# Patient Record
Sex: Female | Born: 1950 | Race: White | Hispanic: No | Marital: Married | Smoking: Current every day smoker
Health system: Southern US, Community
[De-identification: ages and names within clinical notes are randomized; demographics above are authoritative.]

## PROBLEM LIST (undated history)

## (undated) DIAGNOSIS — F419 Anxiety disorder, unspecified: Secondary | ICD-10-CM

## (undated) DIAGNOSIS — F329 Major depressive disorder, single episode, unspecified: Secondary | ICD-10-CM

## (undated) DIAGNOSIS — J449 Chronic obstructive pulmonary disease, unspecified: Secondary | ICD-10-CM

## (undated) DIAGNOSIS — E785 Hyperlipidemia, unspecified: Secondary | ICD-10-CM

## (undated) DIAGNOSIS — F17200 Nicotine dependence, unspecified, uncomplicated: Secondary | ICD-10-CM

## (undated) DIAGNOSIS — F32A Depression, unspecified: Secondary | ICD-10-CM

## (undated) DIAGNOSIS — J40 Bronchitis, not specified as acute or chronic: Secondary | ICD-10-CM

## (undated) HISTORY — PX: ABDOMINAL HYSTERECTOMY: SHX81

## (undated) HISTORY — PX: CHOLECYSTECTOMY: SHX55

## (undated) HISTORY — PX: BACK SURGERY: SHX140

## (undated) HISTORY — PX: CARPAL TUNNEL RELEASE: SHX101

## (undated) HISTORY — PX: KNEE ARTHROSCOPY: SUR90

---

## 1999-11-17 ENCOUNTER — Other Ambulatory Visit: Admission: RE | Admit: 1999-11-17 | Discharge: 1999-11-17 | Payer: Self-pay | Admitting: Gynecology

## 2001-10-05 ENCOUNTER — Encounter: Payer: Self-pay | Admitting: Family Medicine

## 2001-10-05 ENCOUNTER — Encounter: Admission: RE | Admit: 2001-10-05 | Discharge: 2001-10-05 | Payer: Self-pay | Admitting: Family Medicine

## 2003-11-23 ENCOUNTER — Emergency Department (HOSPITAL_COMMUNITY): Admission: EM | Admit: 2003-11-23 | Discharge: 2003-11-24 | Payer: Self-pay | Admitting: Emergency Medicine

## 2003-12-10 ENCOUNTER — Other Ambulatory Visit: Admission: RE | Admit: 2003-12-10 | Discharge: 2003-12-10 | Payer: Self-pay | Admitting: Gynecology

## 2004-11-27 ENCOUNTER — Encounter: Admission: RE | Admit: 2004-11-27 | Discharge: 2004-11-27 | Payer: Self-pay | Admitting: Orthopaedic Surgery

## 2009-04-09 ENCOUNTER — Encounter: Admission: RE | Admit: 2009-04-09 | Discharge: 2009-04-09 | Payer: Self-pay | Admitting: Physician Assistant

## 2010-10-04 HISTORY — PX: CERVICAL DISC SURGERY: SHX588

## 2011-06-15 ENCOUNTER — Other Ambulatory Visit: Payer: Self-pay | Admitting: Orthopedic Surgery

## 2011-06-15 DIAGNOSIS — M25519 Pain in unspecified shoulder: Secondary | ICD-10-CM

## 2011-06-15 DIAGNOSIS — M542 Cervicalgia: Secondary | ICD-10-CM

## 2011-06-16 ENCOUNTER — Ambulatory Visit
Admission: RE | Admit: 2011-06-16 | Discharge: 2011-06-16 | Disposition: A | Discharge status: Home / Self Care | Payer: PRIVATE HEALTH INSURANCE | Source: Ambulatory Visit | Attending: Orthopedic Surgery | Admitting: Orthopedic Surgery

## 2011-06-16 DIAGNOSIS — M25519 Pain in unspecified shoulder: Secondary | ICD-10-CM

## 2011-06-16 DIAGNOSIS — M542 Cervicalgia: Secondary | ICD-10-CM

## 2011-06-23 ENCOUNTER — Other Ambulatory Visit (HOSPITAL_COMMUNITY): Payer: Self-pay | Admitting: Specialist

## 2011-06-23 ENCOUNTER — Encounter (HOSPITAL_COMMUNITY)
Admission: RE | Admit: 2011-06-23 | Discharge: 2011-06-23 | Disposition: A | Discharge status: Home / Self Care | Payer: PRIVATE HEALTH INSURANCE | Source: Ambulatory Visit | Attending: Specialist | Admitting: Specialist

## 2011-06-23 DIAGNOSIS — M5020 Other cervical disc displacement, unspecified cervical region: Secondary | ICD-10-CM

## 2011-06-23 LAB — DIFFERENTIAL
Basophils Relative: 0 % (ref 0–1)
Eosinophils Absolute: 0.5 10*3/uL (ref 0.0–0.7)
Monocytes Relative: 8 % (ref 3–12)
Neutrophils Relative %: 50 % (ref 43–77)

## 2011-06-23 LAB — URINALYSIS, ROUTINE W REFLEX MICROSCOPIC
Ketones, ur: 15 mg/dL — AB
Leukocytes, UA: NEGATIVE
Nitrite: NEGATIVE
Urobilinogen, UA: 0.2 mg/dL (ref 0.0–1.0)
pH: 5 (ref 5.0–8.0)

## 2011-06-23 LAB — CBC
MCH: 32.1 pg (ref 26.0–34.0)
Platelets: 285 10*3/uL (ref 150–400)
RBC: 4.68 MIL/uL (ref 3.87–5.11)
RDW: 13.7 % (ref 11.5–15.5)
WBC: 10.4 10*3/uL (ref 4.0–10.5)

## 2011-06-23 LAB — SURGICAL PCR SCREEN
MRSA, PCR: NEGATIVE
Staphylococcus aureus: NEGATIVE

## 2011-06-23 LAB — COMPREHENSIVE METABOLIC PANEL
ALT: 19 U/L (ref 0–35)
BUN: 13 mg/dL (ref 6–23)
Calcium: 9.9 mg/dL (ref 8.4–10.5)
Creatinine, Ser: 0.81 mg/dL (ref 0.50–1.10)
GFR calc Af Amer: >60 mL/min (ref >60)
GFR calc non Af Amer: >60 mL/min (ref >60)
Glucose, Bld: 83 mg/dL (ref 70–99)
Sodium: 140 mEq/L (ref 135–145)
Total Protein: 7 g/dL (ref 6.0–8.3)

## 2011-06-25 ENCOUNTER — Ambulatory Visit (HOSPITAL_COMMUNITY)
Admission: RE | Admit: 2011-06-25 | Discharge: 2011-06-26 | Disposition: A | Discharge status: Home / Self Care | Payer: PRIVATE HEALTH INSURANCE | Source: Ambulatory Visit | Attending: Specialist | Admitting: Specialist

## 2011-06-25 ENCOUNTER — Ambulatory Visit (HOSPITAL_COMMUNITY): Payer: PRIVATE HEALTH INSURANCE

## 2011-06-25 DIAGNOSIS — Z01812 Encounter for preprocedural laboratory examination: Secondary | ICD-10-CM | POA: Insufficient documentation

## 2011-06-25 DIAGNOSIS — M5020 Other cervical disc displacement, unspecified cervical region: Secondary | ICD-10-CM | POA: Insufficient documentation

## 2011-06-25 DIAGNOSIS — Z01818 Encounter for other preprocedural examination: Secondary | ICD-10-CM | POA: Insufficient documentation

## 2011-06-25 DIAGNOSIS — Z01810 Encounter for preprocedural cardiovascular examination: Secondary | ICD-10-CM | POA: Insufficient documentation

## 2011-06-25 DIAGNOSIS — F17200 Nicotine dependence, unspecified, uncomplicated: Secondary | ICD-10-CM | POA: Insufficient documentation

## 2011-07-06 NOTE — Op Note (Signed)
Tina [name], Tina [name] NO.:  [id number]  MEDICAL RECORD NO.:  [id number]  LOCATION:  5015                         FACILITY:  [facility]  PHYSICIAN:  Kerrin Champagne, M.D.   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  Minimal protrusion at C6-C7.  POSTOPERATIVE DIAGNOSIS:  A large central disk protrusion with disk osteophyte complex, disk material extending centrally into the spinal canal causing cord compression.  PROCEDURES:  Anterior cervical diskectomy with excision of HNP utilizing the operating room microscope, excision of posterior lip osteophytes at the C5 and C6 level with fusion utilizing musculoskeletal transplant foundation allograft 9 mm height x 13 mm depth in combination with local bone graft.  DePuy Slimline 14-mm plate with 16-XW screws.  SURGEON:  Kerrin Champagne, MD  ASSISTANT:  Patrick Jupiter, CRNFA.  ANESTHESIA:  General via orotracheal intubation, Dr. Krista Blue, supplemented with local infiltration with Marcaine 0.5% with 1:200,000 epinephrine, 10 mL.  DRAINS:  A 7-French TLS drain anterior neck.  COMPLICATIONS:  None.  BRIEF CLINICAL HISTORY:  The patient is a 60 year old female, who presents with severe neck pain radiation into her shoulders.  Pain has been present for over 4 weeks.  She was seen initially and evaluated by my partner.  MRI scan was obtained which demonstrated a large disk herniation centrally at the C5-C6 level with cord compression and very early edema changes.  The patient's pain is diffuse, posterior neck, interscapular into the shoulders without a great deal of her extremity weakness.  She has severe pain requiring narcotic medicines to relieve her discomfort.  Guarded cervical motion.  This disk herniation as showing cord compression, it was felt that  anterior cervical diskectomy fusion was appropriate in order to relieve her pain for improvement in overall function and decrease risk of developing myelomalacia and myelopathy.  Cervical canal was narrowed down to nearly 5 mm.  Small protrusion also at the C6-C7 level, however, it was felt not to be compressive.  DESCRIPTION OF PROCEDURE:  This patient was seen in the preoperative holding area after discussion regarding surgery, risks involved, all questions were answered, she has signed informed consent.  She had marking of the left neck skin crease over the expected incision at the C5-C6 level.  She had received standard preoperative antibiotics of Ancef.  The patient transported via a stretcher to [facility] room #[facility] at [facility] where the procedure was performed.  There, she underwent induction of general anesthesia after first transferred to the OR table. Following atraumatic intubation and induction of anesthesia, the patient had 5 pounds cervical Halter traction placed with the neck in very slight extension every 5-10 degrees.  Wrist restraints were used for radiographs intraoperatively.  PAS hose with the patient in a semi-beach- chair position.  Standard prep with DuraPrep solution over the anterior neck and upper chest, draped in the usual manner.  Iodine Vi-Drape was used.  Standard time-out protocol identifying the patient and level to be performed, expected length of case, estimated  blood loss.  This incision was then made of the skin crease at the expected C5-C6 level approximately 3-3.5 inches in length through the skin and subcu layers down to the platysma layer.  This was incised in line with the skin incision and the interval between the trachea and esophagus, medial carotid sheath and lateral was then developed bluntly to the anterior aspect of cervical spine.  The patient's trachea and esophagus were then carefully retracted medially where the longus colli muscle  identified and Kittner dissector was used to tease the prevertebral fascia across midline.  Carotid tubercle identified and spinal needle 18 gauge with only a centimeter of the needle protruding beyond the sheath was then carefully inserted at the expected C5-C6 level.  Intraoperative lateral radiograph demonstrated the needle at the C5-C6 level.  With this then, handheld Cloward retractors were used and the needle carefully extracted.  A 15 blade scalpel was used to incise disk anteriorly and disk was then carefully removed for continued identification of this level throughout the procedure.  Electrocautery was used to carefully debride periosteum off the anterior aspect of the disk space at the C5-C6 level up above and below the disk space by about 3-4 mm.  Osteophytes anteriorly were carefully resected and preserved for later use for bone grafting of the central portions of the allograft.  The medial border of the longus colli muscle was carefully developed bilaterally and then [facility] retractor placed with further blade beneath the medial border of the longus colli muscle.  The 14-mm screw posts were then inserted into the vertebral bodies at C5 and C6 and distraction of the disk space obtained.  Using loupe medication and headlamp, the anterior aspect of the disk was then excised from anterior to posterior using a 15 blade scalpel, pituitary rongeurs, and then 4 straight forward-angled 2-0 and 3-0 micro curettes, debriding the endplates, cartilaginous endplate material as well as degenerative disk within the disk space back to the posterior lip osteophytes and posterior annulus at this level.  Wherever possible bone that was removed was saved for later bone grafting purposes.  Operating room microscope was carefully draped sterilely, brought into the field under the OR microscope and then the posterior lip osteophytes were resected.  This bone also carefully preserved  for later use for bone grafting purposes beginning centrally on the right side, osteophytes resected, then a foraminotomy performed on the right C6 nerve root.  Then to the left side, similarly resecting posterior lip osteophytes of the posterosuperior aspect of C6 and then resecting disk material centrally using pituitary rongeurs as well as a micro titanium nerve hook, removing and debriding this off the spinal cord.  Posterior longitudinal ligament was completely resected side-to-side in order to allow for further decompression of the cord.  Posterior lip osteophyte off the posteroinferior aspect of C5 was then resected similarly from side-to-side left-to-right decompressing the spinal canal of C6 nerve roots.  Tough bone had been removed to allow for packing of bone graft. The height of the intervertebral disk space then determined using sounders after first determining the depth of the disk space at about 19 mm.  Checking first with an 8 mm which was not a good fit, 9 mm provided best fit for height at this space.  High-speed bur was used carefully parallel the endplates of C5 and C6.  The expected allograft was then carefully packed centrally with bone graft provided locally.  This was then completed, the allograft was then carefully inserted. Care  was taken to ensure there was no bone or soft tissue remaining that could be retropulsed with insertion of the graft.  Note that thrombin- soaked Gelfoam was used to obtain hemostasis over the endplates and this was removed.  The graft was then carefully inserted in place and packed it into place without difficulty.  Subset beneath the anterior aspect of the disk space by 1 or 2 mm.  Screw posts were then carefully removed, 5- pound cervical Halter traction released.  Anterior lip osteophytes were carefully trimmed and thinned to allow for placement of the plate, needle against the anterior surface of the disk space and vertebral  body of C5-C6.  A 14-mm plate was chosen, plate carefully placed across the disk space at the C5-C6 level, and first screw be placed on the left side at C5 using a 14-mm drill with soft tissue sleeve, positive stop. This drill hole was made without difficulty and 14-mm screw placed and then on the left at the C6 level, similarly drilling 14 mm then placing the screw.  On the right side at C6 drilling 14 mm and placing a 14-mm screw obtaining purchase that was excellent.  With these, final screw on the right side at the C5 level was drilled and screw placed.  However, the screw did not appear to capture well so that a revision screw was used at this level 14 mm in length and this did obtain excellent purchase.  All the screws subset into the plate quite nicely.  The locking screw was then carefully turned for each of the screws using the appropriate triangular screwdriver.  This completed the fixation at this level.  Irrigation was carried out.  A small bleeder over the left side vein was carefully suture ligated with 0 Vicryl stitch.  Note that, following irrigation then a 7-French TLS drain was then placed in the depth of the incision exiting over the inferior aspect of the incision site, sewn in place with a 4-0 nylon stitch.  Intraoperative lateral radiograph obtained demonstrated plates and screws in excellent position and alignment with no sign of retropulsion of graft or screws at this point.  With this then, irrigation was further carried out and esophagus examined and appeared normal.  Platysmal layer was reapproximated with interrupted 2-0 Vicryl sutures, deep subcu layers approximated with interrupted 3-0 Vicryl sutures, and the skin closed with a running subcu stitch of 4-0 Vicryl.  Dermabond was applied and a Mepilex bandage. Soft cervical collar.  The patient was then carefully returned to her bed, reactivated, extubated, returned to recovery room in satisfactory condition.   All instrument and sponge counts were correct.  CRNFA ASSISTANT'S RESPONSIBILITY:  Patrick Jupiter, performed the duties of CRNFA assisting throughout the case, careful suctioning, neural elements, and careful resecting bone to be utilized for later bone grafting purposes, placing the screw within the plate on the right side during this case.  She operated under the operating room microscope and assisted throughout the procedure.     Kerrin Champagne, M.D.     JEN/MEDQ  D:  06/25/2011  T:  06/25/2011  Job:  [id number]  Electronically Signed by Vira Browns M.D. on 07/06/2011 06:16:00 PM

## 2011-09-15 ENCOUNTER — Other Ambulatory Visit: Payer: Self-pay | Admitting: Specialist

## 2011-09-15 DIAGNOSIS — M545 Low back pain, unspecified: Secondary | ICD-10-CM

## 2011-09-16 ENCOUNTER — Ambulatory Visit
Admission: RE | Admit: 2011-09-16 | Discharge: 2011-09-16 | Disposition: A | Discharge status: Home / Self Care | Payer: PRIVATE HEALTH INSURANCE | Source: Ambulatory Visit | Attending: Specialist | Admitting: Specialist

## 2011-09-16 DIAGNOSIS — M545 Low back pain, unspecified: Secondary | ICD-10-CM

## 2011-09-23 ENCOUNTER — Other Ambulatory Visit (HOSPITAL_COMMUNITY): Payer: Self-pay | Admitting: Specialist

## 2011-09-23 ENCOUNTER — Encounter (HOSPITAL_COMMUNITY): Payer: Self-pay | Admitting: Respiratory Therapy

## 2011-09-24 ENCOUNTER — Encounter (HOSPITAL_COMMUNITY)
Admission: RE | Admit: 2011-09-24 | Discharge: 2011-09-24 | Disposition: A | Discharge status: Home / Self Care | Payer: PRIVATE HEALTH INSURANCE | Source: Ambulatory Visit | Attending: Specialist | Admitting: Specialist

## 2011-09-24 ENCOUNTER — Encounter (HOSPITAL_COMMUNITY): Payer: Self-pay

## 2011-09-24 HISTORY — DX: Major depressive disorder, single episode, unspecified: F32.9

## 2011-09-24 HISTORY — DX: Depression, unspecified: F32.A

## 2011-09-24 HISTORY — DX: Bronchitis, not specified as acute or chronic: J40

## 2011-09-24 LAB — CBC
Platelets: 274 10*3/uL (ref 150–400)
RBC: 4.62 MIL/uL (ref 3.87–5.11)
RDW: 13.8 % (ref 11.5–15.5)
WBC: 10.7 10*3/uL — ABNORMAL HIGH (ref 4.0–10.5)

## 2011-09-24 LAB — BASIC METABOLIC PANEL
CO2: 29 mEq/L (ref 19–32)
Chloride: 102 mEq/L (ref 96–112)
GFR calc Af Amer: >90 mL/min (ref >90)
Sodium: 139 mEq/L (ref 135–145)

## 2011-09-24 LAB — SURGICAL PCR SCREEN
MRSA, PCR: NEGATIVE
Staphylococcus aureus: NEGATIVE

## 2011-09-24 MED ORDER — CHLORHEXIDINE GLUCONATE 4 % EX LIQD: 60.0000 mL | Freq: Once | CUTANEOUS | Status: DC

## 2011-09-24 NOTE — Progress Notes (Signed)
Ekg, chest in epic from 9/12

## 2011-09-24 NOTE — Pre-Procedure Instructions (Signed)
[address]  09/24/2011   Your procedure is scheduled on:  09/30/11   Report to [facility] at 1030 AM.  Call this number if you have problems the morning of surgery: [phone]   Remember:   Do not eat food:After Midnight.  May have clear liquids: up to 4 Hours before arrival.  Clear liquids include soda, tea, black coffee, apple or grape juice, broth.  Take these medicines the morning of surgery with A SIP OF WATER:xanax , pain med   Do not wear jewelry, make-up or nail polish.  Do not wear lotions, powders, or perfumes. You may wear deodorant.  Do not shave 48 hours prior to surgery.  Do not bring valuables to the hospital.  Contacts, dentures or bridgework may not be worn into surgery.  Leave suitcase in the car. After surgery it may be brought to your room.  For patients admitted to the hospital, checkout time is 11:00 AM the day of discharge.   Patients discharged the day of surgery will not be allowed to drive home.  Name and phone number of your driver:[phone]  Special Instructions: CHG Shower Use Special Wash: 1/2 bottle night before surgery and 1/2 bottle morning of surgery.   Please read over the following fact sheets that you were given: Pain Booklet, Coughing and Deep Breathing, MRSA Information and Surgical Site Infection Prevention

## 2011-09-29 MED ORDER — CEFAZOLIN SODIUM-DEXTROSE 2-3 GM-% IV SOLR
2.0000 g | INTRAVENOUS | Status: AC
  Administered 2011-09-30: 2 g via INTRAVENOUS
  Filled 2011-09-29: qty 50

## 2011-09-30 ENCOUNTER — Encounter (HOSPITAL_COMMUNITY): Payer: Self-pay | Admitting: *Deleted

## 2011-09-30 ENCOUNTER — Encounter (HOSPITAL_COMMUNITY)
Admission: RE | Disposition: A | Discharge status: Home / Self Care | Payer: Self-pay | Source: Ambulatory Visit | Attending: Specialist

## 2011-09-30 ENCOUNTER — Ambulatory Visit (HOSPITAL_COMMUNITY)
Admission: RE | Admit: 2011-09-30 | Discharge: 2011-10-01 | Disposition: A | Discharge status: Home / Self Care | Payer: PRIVATE HEALTH INSURANCE | Source: Ambulatory Visit | Attending: Specialist | Admitting: Specialist

## 2011-09-30 ENCOUNTER — Encounter (HOSPITAL_COMMUNITY): Payer: Self-pay | Admitting: Anesthesiology

## 2011-09-30 ENCOUNTER — Ambulatory Visit (HOSPITAL_COMMUNITY): Payer: PRIVATE HEALTH INSURANCE

## 2011-09-30 ENCOUNTER — Ambulatory Visit (HOSPITAL_COMMUNITY): Payer: PRIVATE HEALTH INSURANCE | Admitting: Anesthesiology

## 2011-09-30 ENCOUNTER — Encounter (HOSPITAL_COMMUNITY): Payer: Self-pay | Admitting: Specialist

## 2011-09-30 DIAGNOSIS — G8929 Other chronic pain: Secondary | ICD-10-CM | POA: Insufficient documentation

## 2011-09-30 DIAGNOSIS — F329 Major depressive disorder, single episode, unspecified: Secondary | ICD-10-CM | POA: Insufficient documentation

## 2011-09-30 DIAGNOSIS — F17200 Nicotine dependence, unspecified, uncomplicated: Secondary | ICD-10-CM | POA: Insufficient documentation

## 2011-09-30 DIAGNOSIS — G9741 Accidental puncture or laceration of dura during a procedure: Secondary | ICD-10-CM | POA: Insufficient documentation

## 2011-09-30 DIAGNOSIS — Y9210 Unspecified residential institution as the place of occurrence of the external cause: Secondary | ICD-10-CM | POA: Insufficient documentation

## 2011-09-30 DIAGNOSIS — IMO0002 Reserved for concepts with insufficient information to code with codable children: Secondary | ICD-10-CM | POA: Insufficient documentation

## 2011-09-30 DIAGNOSIS — J449 Chronic obstructive pulmonary disease, unspecified: Secondary | ICD-10-CM | POA: Insufficient documentation

## 2011-09-30 DIAGNOSIS — F3289 Other specified depressive episodes: Secondary | ICD-10-CM | POA: Insufficient documentation

## 2011-09-30 DIAGNOSIS — M5126 Other intervertebral disc displacement, lumbar region: Secondary | ICD-10-CM | POA: Insufficient documentation

## 2011-09-30 DIAGNOSIS — Z01812 Encounter for preprocedural laboratory examination: Secondary | ICD-10-CM | POA: Insufficient documentation

## 2011-09-30 DIAGNOSIS — J4489 Other specified chronic obstructive pulmonary disease: Secondary | ICD-10-CM | POA: Insufficient documentation

## 2011-09-30 DIAGNOSIS — M5116 Intervertebral disc disorders with radiculopathy, lumbar region: Secondary | ICD-10-CM

## 2011-09-30 HISTORY — PX: LUMBAR LAMINECTOMY: SHX95

## 2011-09-30 SURGERY — MICRODISCECTOMY LUMBAR LAMINECTOMY
Anesthesia: General | Site: Back | Laterality: Right | Wound class: Clean

## 2011-09-30 MED ORDER — GLYCOPYRROLATE 0.2 MG/ML IJ SOLN
INTRAMUSCULAR | Status: DC | PRN
  Administered 2011-09-30: .6 mg via INTRAVENOUS

## 2011-09-30 MED ORDER — PROMETHAZINE HCL 25 MG/ML IJ SOLN
6.2500 mg | INTRAMUSCULAR | Status: DC | PRN
  Administered 2011-09-30: 12.5 mg via INTRAVENOUS

## 2011-09-30 MED ORDER — ONDANSETRON HCL 4 MG/2ML IJ SOLN
INTRAMUSCULAR | Status: DC | PRN
  Administered 2011-09-30 (×2): 4 mg via INTRAVENOUS

## 2011-09-30 MED ORDER — ALPRAZOLAM 0.5 MG PO TABS
0.5000 mg | ORAL_TABLET | Freq: Two times a day (BID) | ORAL | Status: DC
  Administered 2011-09-30 – 2011-10-01 (×2): 0.5 mg via ORAL
  Filled 2011-09-30 (×2): qty 1

## 2011-09-30 MED ORDER — PROMETHAZINE HCL 25 MG/ML IJ SOLN
INTRAMUSCULAR | Status: AC
  Filled 2011-09-30: qty 1

## 2011-09-30 MED ORDER — FENTANYL CITRATE 0.05 MG/ML IJ SOLN: 50.0000 ug | INTRAMUSCULAR | Status: DC | PRN

## 2011-09-30 MED ORDER — ACETAMINOPHEN 650 MG RE SUPP: 650.0000 mg | RECTAL | Status: DC | PRN

## 2011-09-30 MED ORDER — NEOSTIGMINE METHYLSULFATE 1 MG/ML IJ SOLN
INTRAMUSCULAR | Status: DC | PRN
  Administered 2011-09-30: 5 mg via INTRAVENOUS

## 2011-09-30 MED ORDER — THERA M PLUS PO TABS: 1.0000 | ORAL_TABLET | Freq: Every day | ORAL | Status: DC

## 2011-09-30 MED ORDER — PROPOFOL 10 MG/ML IV EMUL
INTRAVENOUS | Status: DC | PRN
  Administered 2011-09-30: 150 mg via INTRAVENOUS

## 2011-09-30 MED ORDER — ROCURONIUM BROMIDE 100 MG/10ML IV SOLN
INTRAVENOUS | Status: DC | PRN
  Administered 2011-09-30: 20 mg via INTRAVENOUS
  Administered 2011-09-30: 50 mg via INTRAVENOUS
  Administered 2011-09-30: 20 mg via INTRAVENOUS
  Administered 2011-09-30: 10 mg via INTRAVENOUS

## 2011-09-30 MED ORDER — VECURONIUM BROMIDE 10 MG IV SOLR
INTRAVENOUS | Status: DC | PRN
  Administered 2011-09-30: 2 mg via INTRAVENOUS
  Administered 2011-09-30: 3 mg via INTRAVENOUS

## 2011-09-30 MED ORDER — POTASSIUM CHLORIDE IN NACL 20-0.9 MEQ/L-% IV SOLN
INTRAVENOUS | Status: DC
  Administered 2011-09-30: 22:00:00 via INTRAVENOUS
  Filled 2011-09-30 (×3): qty 1000

## 2011-09-30 MED ORDER — ALUM & MAG HYDROXIDE-SIMETH 200-200-20 MG/5ML PO SUSP: 30.0000 mL | Freq: Four times a day (QID) | ORAL | Status: DC | PRN

## 2011-09-30 MED ORDER — DEXTROSE 5 % IV SOLN
INTRAVENOUS | Status: DC | PRN
  Administered 2011-09-30: 13:00:00 via INTRAVENOUS

## 2011-09-30 MED ORDER — CEFAZOLIN SODIUM 1-5 GM-% IV SOLN
1.0000 g | Freq: Three times a day (TID) | INTRAVENOUS | Status: AC
  Administered 2011-09-30 – 2011-10-01 (×2): 1 g via INTRAVENOUS
  Filled 2011-09-30 (×2): qty 50

## 2011-09-30 MED ORDER — PHENOL 1.4 % MT LIQD
1.0000 | OROMUCOSAL | Status: DC | PRN
  Filled 2011-09-30: qty 177

## 2011-09-30 MED ORDER — BUPIVACAINE-EPINEPHRINE 0.5% -1:200000 IJ SOLN
INTRAMUSCULAR | Status: DC | PRN
  Administered 2011-09-30: 20 mL

## 2011-09-30 MED ORDER — METHOCARBAMOL 100 MG/ML IJ SOLN
500.0000 mg | Freq: Four times a day (QID) | INTRAVENOUS | Status: DC | PRN
  Administered 2011-09-30: 500 mg via INTRAVENOUS
  Filled 2011-09-30: qty 5

## 2011-09-30 MED ORDER — 0.9 % SODIUM CHLORIDE (POUR BTL) OPTIME
TOPICAL | Status: DC | PRN
  Administered 2011-09-30: 1000 mL

## 2011-09-30 MED ORDER — LACTATED RINGERS IV SOLN
INTRAVENOUS | Status: DC | PRN
  Administered 2011-09-30 (×3): via INTRAVENOUS

## 2011-09-30 MED ORDER — SODIUM CHLORIDE 0.9 % IJ SOLN: 3.0000 mL | Freq: Two times a day (BID) | INTRAMUSCULAR | Status: DC

## 2011-09-30 MED ORDER — MIDAZOLAM HCL 5 MG/5ML IJ SOLN
INTRAMUSCULAR | Status: DC | PRN
  Administered 2011-09-30: 2 mg via INTRAVENOUS

## 2011-09-30 MED ORDER — SERTRALINE HCL 50 MG PO TABS
175.0000 mg | ORAL_TABLET | Freq: Every day | ORAL | Status: DC
  Administered 2011-10-01: 175 mg via ORAL
  Filled 2011-09-30: qty 1

## 2011-09-30 MED ORDER — DROPERIDOL 2.5 MG/ML IJ SOLN
INTRAMUSCULAR | Status: DC | PRN
  Administered 2011-09-30: 0.625 mg via INTRAVENOUS

## 2011-09-30 MED ORDER — HETASTARCH-ELECTROLYTES 6 % IV SOLN: INTRAVENOUS | Status: DC | PRN

## 2011-09-30 MED ORDER — SODIUM CHLORIDE 0.9 % IJ SOLN: 3.0000 mL | INTRAMUSCULAR | Status: DC | PRN

## 2011-09-30 MED ORDER — SODIUM CHLORIDE 0.9 % IV SOLN: 250.0000 mL | INTRAVENOUS | Status: DC

## 2011-09-30 MED ORDER — EPHEDRINE SULFATE 50 MG/ML IJ SOLN
INTRAMUSCULAR | Status: DC | PRN
  Administered 2011-09-30: 10 mg via INTRAVENOUS
  Administered 2011-09-30 (×2): 5 mg via INTRAVENOUS

## 2011-09-30 MED ORDER — PHENYLEPHRINE HCL 10 MG/ML IJ SOLN
INTRAMUSCULAR | Status: DC | PRN
  Administered 2011-09-30 (×3): 40 ug via INTRAVENOUS

## 2011-09-30 MED ORDER — ONDANSETRON HCL 4 MG/2ML IJ SOLN: 4.0000 mg | INTRAMUSCULAR | Status: DC | PRN

## 2011-09-30 MED ORDER — PANTOPRAZOLE SODIUM 40 MG IV SOLR
40.0000 mg | Freq: Every day | INTRAVENOUS | Status: DC
  Administered 2011-09-30: 40 mg via INTRAVENOUS
  Filled 2011-09-30 (×2): qty 40

## 2011-09-30 MED ORDER — LACTATED RINGERS IV SOLN
INTRAVENOUS | Status: DC
  Administered 2011-09-30: 11:00:00 via INTRAVENOUS

## 2011-09-30 MED ORDER — FENTANYL CITRATE 0.05 MG/ML IJ SOLN
INTRAMUSCULAR | Status: AC
  Filled 2011-09-30: qty 2

## 2011-09-30 MED ORDER — ONDANSETRON HCL 4 MG/2ML IJ SOLN: 4.0000 mg | Freq: Four times a day (QID) | INTRAMUSCULAR | Status: DC | PRN

## 2011-09-30 MED ORDER — DESIPRAMINE HCL 50 MG PO TABS
50.0000 mg | ORAL_TABLET | Freq: Every day | ORAL | Status: DC
  Administered 2011-09-30: 50 mg via ORAL
  Filled 2011-09-30 (×2): qty 1

## 2011-09-30 MED ORDER — THROMBIN 20000 UNITS EX KIT
PACK | CUTANEOUS | Status: DC | PRN
  Administered 2011-09-30: 14:00:00 via TOPICAL

## 2011-09-30 MED ORDER — ZOLPIDEM TARTRATE 10 MG PO TABS: 10.0000 mg | ORAL_TABLET | Freq: Every evening | ORAL | Status: DC | PRN

## 2011-09-30 MED ORDER — ADULT MULTIVITAMIN W/MINERALS CH
1.0000 | ORAL_TABLET | Freq: Every day | ORAL | Status: DC
Start: 2011-10-01 — End: 2011-10-01
  Administered 2011-10-01: 1 via ORAL
  Filled 2011-09-30: qty 1

## 2011-09-30 MED ORDER — THROMBIN 20000 UNITS EX SOLR: CUTANEOUS | Status: DC | PRN

## 2011-09-30 MED ORDER — MENTHOL 3 MG MT LOZG: 1.0000 | LOZENGE | OROMUCOSAL | Status: DC | PRN

## 2011-09-30 MED ORDER — FENTANYL CITRATE 0.05 MG/ML IJ SOLN
25.0000 ug | INTRAMUSCULAR | Status: DC | PRN
  Administered 2011-09-30 (×3): 50 ug via INTRAVENOUS

## 2011-09-30 MED ORDER — PHENYLEPHRINE HCL 10 MG/ML IJ SOLN
10.0000 mg | INTRAVENOUS | Status: DC | PRN
  Administered 2011-09-30: 10 ug/min via INTRAVENOUS

## 2011-09-30 MED ORDER — LORAZEPAM 2 MG/ML IJ SOLN: 1.0000 mg | Freq: Once | INTRAMUSCULAR | Status: DC | PRN

## 2011-09-30 MED ORDER — METHOCARBAMOL 500 MG PO TABS
500.0000 mg | ORAL_TABLET | Freq: Four times a day (QID) | ORAL | Status: DC | PRN
  Administered 2011-10-01 (×3): 500 mg via ORAL
  Filled 2011-09-30 (×4): qty 1

## 2011-09-30 MED ORDER — MIDAZOLAM HCL 2 MG/2ML IJ SOLN: 1.0000 mg | INTRAMUSCULAR | Status: DC | PRN

## 2011-09-30 MED ORDER — SIMVASTATIN 20 MG PO TABS
20.0000 mg | ORAL_TABLET | Freq: Every day | ORAL | Status: DC
  Administered 2011-09-30: 20 mg via ORAL
  Filled 2011-09-30 (×2): qty 1

## 2011-09-30 MED ORDER — HYDROCODONE-ACETAMINOPHEN 5-325 MG PO TABS
1.0000 | ORAL_TABLET | ORAL | Status: DC | PRN
  Administered 2011-09-30: 1 via ORAL
  Administered 2011-10-01 (×2): 2 via ORAL
  Administered 2011-10-01: 1 via ORAL
  Administered 2011-10-01: 2 via ORAL
  Filled 2011-09-30 (×2): qty 1
  Filled 2011-09-30 (×3): qty 2

## 2011-09-30 MED ORDER — HEMOSTATIC AGENTS (NO CHARGE) OPTIME
TOPICAL | Status: DC | PRN
  Administered 2011-09-30: 1 via TOPICAL

## 2011-09-30 MED ORDER — HETASTARCH-ELECTROLYTES 6 % IV SOLN
INTRAVENOUS | Status: DC | PRN
  Administered 2011-09-30 (×2): via INTRAVENOUS

## 2011-09-30 MED ORDER — FENTANYL CITRATE 0.05 MG/ML IJ SOLN
INTRAMUSCULAR | Status: DC | PRN
  Administered 2011-09-30 (×5): 50 ug via INTRAVENOUS
  Administered 2011-09-30: 100 ug via INTRAVENOUS
  Administered 2011-09-30: 150 ug via INTRAVENOUS
  Administered 2011-09-30: 100 ug via INTRAVENOUS

## 2011-09-30 MED ORDER — DEXAMETHASONE SODIUM PHOSPHATE 4 MG/ML IJ SOLN
INTRAMUSCULAR | Status: DC | PRN
  Administered 2011-09-30: 8 mg via INTRAVENOUS

## 2011-09-30 SURGICAL SUPPLY — 67 items
ADH SKN CLS APL DERMABOND .7 (GAUZE/BANDAGES/DRESSINGS) ×1
ADH SKN CLS LQ APL DERMABOND (GAUZE/BANDAGES/DRESSINGS) ×1
BUR RND FLUTED 2.5 (BURR) IMPLANT
BUR ROUND FLUTED 4 SOFT TCH (BURR) IMPLANT
BUR SABER RD CUTTING 3.0 (BURR) ×2 IMPLANT
CANISTER SUCTION 2500CC (MISCELLANEOUS) ×2 IMPLANT
CLOTH BEACON ORANGE TIMEOUT ST (SAFETY) ×2 IMPLANT
CORDS BIPOLAR (ELECTRODE) ×2 IMPLANT
COVER SURGICAL LIGHT HANDLE (MISCELLANEOUS) ×2 IMPLANT
COVIDIEN EXTENDED TIP APPLICATOR 8CM ×1 IMPLANT
DERMABOND ADHESIVE PROPEN (GAUZE/BANDAGES/DRESSINGS) ×1
DERMABOND ADVANCED (GAUZE/BANDAGES/DRESSINGS) ×1
DERMABOND ADVANCED .7 DNX12 (GAUZE/BANDAGES/DRESSINGS) ×1 IMPLANT
DERMABOND ADVANCED .7 DNX6 (GAUZE/BANDAGES/DRESSINGS) IMPLANT
DRAPE C-ARM 42X72 X-RAY (DRAPES) ×2 IMPLANT
DRAPE MICROSCOPE LEICA (MISCELLANEOUS) ×3 IMPLANT
DRAPE POUCH INSTRU U-SHP 10X18 (DRAPES) ×2 IMPLANT
DRAPE PROXIMA HALF (DRAPES) ×2 IMPLANT
DRAPE SURG 17X23 STRL (DRAPES) ×8 IMPLANT
DRSG MEPILEX BORDER 4X4 (GAUZE/BANDAGES/DRESSINGS) ×1 IMPLANT
DRSG MEPILEX BORDER 4X8 (GAUZE/BANDAGES/DRESSINGS) IMPLANT
DURA SEAL 5ML(DURAL SEALANT SYSTEM POLYMER KIT) ×1 IMPLANT
DURAPREP 26ML APPLICATOR (WOUND CARE) ×2 IMPLANT
ELECT BLADE 4.0 EZ CLEAN MEGAD (MISCELLANEOUS) ×2
ELECT CAUTERY BLADE 6.4 (BLADE) ×2 IMPLANT
ELECT REM PT RETURN 9FT ADLT (ELECTROSURGICAL) ×2
ELECTRODE BLDE 4.0 EZ CLN MEGD (MISCELLANEOUS) ×1 IMPLANT
ELECTRODE REM PT RTRN 9FT ADLT (ELECTROSURGICAL) ×1 IMPLANT
GLOVE BIOGEL M STRL SZ7.5 (GLOVE) ×1 IMPLANT
GLOVE BIOGEL PI IND STRL 7.5 (GLOVE) ×1 IMPLANT
GLOVE BIOGEL PI INDICATOR 7.5 (GLOVE) ×1
GLOVE ECLIPSE 7.0 STRL STRAW (GLOVE) ×1 IMPLANT
GLOVE ECLIPSE 7.5 STRL STRAW (GLOVE) ×2 IMPLANT
GLOVE ECLIPSE 8.5 STRL (GLOVE) ×2 IMPLANT
GLOVE SKINSENSE NS SZ7.5 (GLOVE) ×1
GLOVE SKINSENSE STRL SZ7.5 (GLOVE) IMPLANT
GLOVE SS BIOGEL STRL SZ 7.5 (GLOVE) IMPLANT
GLOVE SUPERSENSE BIOGEL SZ 7.5 (GLOVE) ×1
GLOVE SURG 8.5 LATEX PF (GLOVE) ×2 IMPLANT
GOWN PREVENTION PLUS LG XLONG (DISPOSABLE) IMPLANT
GOWN STRL NON-REIN LRG LVL3 (GOWN DISPOSABLE) ×6 IMPLANT
GOWN STRL REIN 2XL LVL4 (GOWN DISPOSABLE) ×2 IMPLANT
KIT BASIN OR (CUSTOM PROCEDURE TRAY) ×2 IMPLANT
KIT ROOM TURNOVER OR (KITS) ×2 IMPLANT
NDL SPNL 18GX3.5 QUINCKE PK (NEEDLE) ×2 IMPLANT
NEEDLE 22X1 1/2 (OR ONLY) (NEEDLE) ×2 IMPLANT
NEEDLE SPNL 18GX3.5 QUINCKE PK (NEEDLE) ×4 IMPLANT
NS IRRIG 1000ML POUR BTL (IV SOLUTION) ×2 IMPLANT
PACK LAMINECTOMY ORTHO (CUSTOM PROCEDURE TRAY) ×2 IMPLANT
PAD ARMBOARD 7.5X6 YLW CONV (MISCELLANEOUS) ×4 IMPLANT
PATTIES SURGICAL .5 X.5 (GAUZE/BANDAGES/DRESSINGS) ×4 IMPLANT
PATTIES SURGICAL .75X.75 (GAUZE/BANDAGES/DRESSINGS) IMPLANT
SPONGE INTESTINAL PEANUT (DISPOSABLE) ×1 IMPLANT
SPONGE LAP 4X18 X RAY DECT (DISPOSABLE) ×2 IMPLANT
SPONGE SURGIFOAM ABS GEL 100 (HEMOSTASIS) ×1 IMPLANT
SUT NURALON 4 0 TR CR/8 (SUTURE) ×1 IMPLANT
SUT VIC AB 1 CT1 27 (SUTURE) ×4
SUT VIC AB 1 CT1 27XBRD ANBCTR (SUTURE) IMPLANT
SUT VIC AB 2-0 CT1 27 (SUTURE) ×2
SUT VIC AB 2-0 CT1 TAPERPNT 27 (SUTURE) IMPLANT
SUT VICRYL 0 UR6 27IN ABS (SUTURE) IMPLANT
SUT VICRYL 4-0 PS2 18IN ABS (SUTURE) ×1 IMPLANT
SYR CONTROL 10ML LL (SYRINGE) ×2 IMPLANT
TOWEL OR 17X24 6PK STRL BLUE (TOWEL DISPOSABLE) ×2 IMPLANT
TOWEL OR 17X26 10 PK STRL BLUE (TOWEL DISPOSABLE) ×2 IMPLANT
TRAY FOLEY CATH 14FR (SET/KITS/TRAYS/PACK) ×1 IMPLANT
WATER STERILE IRR 1000ML POUR (IV SOLUTION) ×2 IMPLANT

## 2011-09-30 NOTE — Op Note (Signed)
09/30/2011  6:06 PM  PATIENT:  Tina [name]  60 y.o. 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    SURGEON:  Kerrin Champagne, MD     ANESTHESIA:  General, Dr. Jacklynn Bue    COMPLICATIONS: Small dural the right dural bleb/tear 2-3 mm superior shoulder of the L5 nerve root at 5 o'clock.   PROCEDURE: The patient was met in the holding area, and the appropriate lumbar level identified and marked with "x" and my initials. All questions were answered and informed consent signed. The patient was then transported to OR the stretcher. The patient was then placed under  general anesthesia without difficulty. She was then transferred to the OR table prone position Wilson frame using a sliding OR table. The patient received appropriate preoperative antibiotic prophylaxis of Ancef.  . Nursing staff inserted a Foley catheter under sterile conditions.   Time-out procedure was called and correct. Standard prep with DuraPrep solution and the lower dorsal spine and mid sacral level. The old incision scar was used as a landmark along with top of the pelvis. Patient was obese but an 18-gauge spinal needle was able to be placed at the expected L4-5 level intraoperative sterilely draped C-arm fluoroscopy demonstrated the needle directed at the pedicle of L5. The old incision scar in the midline was infiltrated with Marcaine half percent with 1:200,000 epinephrine. Total of 20 cc was used for infiltration. Approximately 1-1/2 inch incision was made the upper aspect of the incision scar over the expected L4 level down to the lumbodorsal fascia on the right side. The lumbodorsal fascia was incised in line with skin incision.   Headlamp and loupe  magnification was used during this portion of the procedure a narrow dilator was then inserted on the right side of the spinous process at the L4-5 level used to elevate the paralumbar muscles on the right side of the lamina itself for and the facet at L4-5. Progressive dilators were then inserted depth of 80 mm and found to be present. The 80 mm retractors then placed onto the scaffolding for the MIS equipment then inserted over the dilators after first determining the position at the L4-5 level. The scaffolding was then attached to the sterile upright attached to the side of the operating room table. The MIS retracting system was then carefully opened and the retractors canted to provide for exposure. 4 mm Kerrison was used to debride a small amount of muscle off the medial aspect of the L4-5 facet intraoperative radiographs were obtained with Penfield 4 at the L4-5 facet then high-speed bur was used to remove a small portion of the inferior aspect lamina of L4 and right side and approximately 10-15% of the medial aspect of the intra-articular process of L4. Millimeter Kerrison was used to enter the spinal canal over the superior aspect of lamina L5 and a small portion of the superior lamina of L5 was then resected performing foraminotomies of right L5 nerve root. The medial aspect of the L4-5 facet then carefully debrided of ligamentum flavum attachment as well as a small amount of the medial superior to the process of L5 approximately 10%. Ligamentum flavum was then debrided using 3 and 2 mm Kerrisons out to its attachment to the ventral aspect of the  right L4 inferior lamina. The operating room microscope sterilely draped brought into the field after placing a Penfield 4 in the spine the L4-5 level as the correct level for surgical procedure.   Under the operating room microscope then carefully the L5 nerve root was manipulated using a Penfield 4 carefully mobilizing the L5 medially. A dural bleb was  identified along the shoulder of the L5 nerve root and the lateral aspect of the thecal sac about to 2-3  millimeters in length and at about the 5:00 position if one were facing superiorly and 12:00 was directed towards the 6:00 o'clock was directly ventral. Cottonoids were then placed within the lateral recess the lateral aspect of the thecal sac at the 4-5 level directed superiorly ventral to the thecal sac to provide for hemostasis additional cottonoids were placed inferiorly ventral to the L5 nerve root. Derricho retractor used to carefully retract the thecal sac and then the love retractor was used. Epidural bleeding was encountered large epidural veins which required bipolar electrocautery these veins were located along the superior medial aspect of the L5 pedicle in the axillary area the L4 nerve root. Further epidural bleeding was present over the ventral aspect of the spinal canal just medial to the L5 pedicle beneath the L5 nerve root. Thrombin-soaked Gelfoam cottonoids were used for interspace with mobilization of the L5 nerve root lateral thecal sac large disc herniation was encountered in the lateral recess on the right side. This was freed up using a Penfield 4 and then removed using micropituitary and found to be a very large free fragment extending inferiorly and into the L5 neuroforamen this is inferior to the L4-5 disc space. Additional disc material was noted to be extending superiorly and entering into the L4-5 disc space and this was excised using micropituitary rongeur. With this additional bleeding did occur with decompression of the ventral aspect of the spinal canal. Additional thrombin soaked Gelfoam and cottonoids were used to provide for hemostasis. A small dural bleb felt to be about 2-3 mm of margin of the neural elements were visible and at risk of potentially being irritated the margin of the bleb and dural opening here. So that a decision was made to repair the dural tear. To perform  this, the incision was opened and extended further distal proximal and the MIS retractors were removed. Incision total length was extended to approximately 3-1/2 inch in length. Incision through skin subcutaneous layers directly down to the lumbodorsal fascia this was incised on the right side he L5-S1 level and at L4-5 and extended upwards to the 34 level on the right side. Cobb elevator used to elevate the paralumbar muscles, bleeders controlled using electrocautery. Boss McCollough self-retaining retractor was then inserted 80 mm blade necessary for retracting the paralumbar muscles and the medial 60 mm blade use for exposure purposes. Operating room microscope was carefully brought into the field under the operating room microscope and careful further exposure was obtained on the right side extending the semi-hemilaminectomy superior removing nearly 65% of the hemilamina preserving the inferior articular process and the pars area. This was on the right side only. Bleeding controlled using bone wax bipolar electrocautery where necessary. The lateral recess was well decompressed the area of bleb was then easily visualized. It was still felt to be in a difficult position for repair so that a 4-0 Neurolon sutures were then capturing the sleeve of dura posterior to the bleb then using a Penfield #4 to carefully reduce neural elements within the  bleb while capturing the ventral sleeve with a 4-0 Neurolon suture. A running stitch of 2 passes was then tied down using micro-technique attention able to be applied through this suture an additional single suture was then able to be placed more distally along the proximal shoulder of the L5 nerve root care taken again to capture only if he dura mater with a single suture here for Neurolon. With this Valsalva was performed only minimal leakage was present at 30 mmHg bowel sounds. Small epidural bleeding was again encountered and this required further hemostasis and right  angle bipolar was used. When complete hemostasis was obtained and DuraSeal was then able to be placed ventral to the L5 nerve. A long lateral recess done after first exploring the disc on the right side discovered small amount of disc material that was excised using 2 tear rongeur and no attempt was made to remove further disc from the intervertebral disc space. With the DuraSeal in place there was no active leak present at that time.   Further irrigation was carried out and then the incision was closed no drain was used. Lumbodorsal fascia were reapproximated with interrupted #1 Vicryl suture deep subcutaneous layers approximated with interrupted #1 Vicryl sutures per the superficial fatty layers approximated with interrupted 2-0 Vicryl sutures the skin edges careful trimmed to smooth surfaces approximated with interrupted 4-0 Vicryl suture and Dermabond was used and to seal the skin edges. Out to the operating room microscope was used during this procedure sterile drape and brought into the field for the excision of the disc herniation as well as for repair dural leak/ bleb. When the Dermabond was dry a small Mepilex bandage was applied. The patient was then returned to her bed reactivated and extubated and returned to the recovery room in satisfactory condition.  All instrument and sponge counts were correct.            Amaryllis Malmquist E  09/30/2011, 6:06 PM

## 2011-09-30 NOTE — Plan of Care (Signed)
Problem: Consults Goal: Diagnosis - Spinal Surgery L4-L5 Discectomy with L5 nerve decrompression

## 2011-09-30 NOTE — Anesthesia Preprocedure Evaluation (Addendum)
Anesthesia Evaluation  Patient identified by MRN, date of birth, ID band Patient awake    Reviewed: Allergy & Precautions, H&P , NPO status , Patient's Chart, lab work & pertinent test results  Airway Mallampati: II TM Distance: <3 FB Neck ROM: Limited    Dental   Pulmonary COPDCurrent Smoker,    Pulmonary exam normal       Cardiovascular     Neuro/Psych Depression    GI/Hepatic   Endo/Other  Morbid obesity  Renal/GU      Musculoskeletal   Abdominal (+) obese,   Peds  Hematology   Anesthesia Other Findings   Reproductive/Obstetrics                          Anesthesia Physical Anesthesia Plan  ASA: III  Anesthesia Plan: General   Post-op Pain Management:    Induction: Intravenous  Airway Management Planned: Oral ETT  Additional Equipment:   Intra-op Plan:   Post-operative Plan: Extubation in OR  Informed Consent: I have reviewed the patients History and Physical, chart, labs and discussed the procedure including the risks, benefits and alternatives for the proposed anesthesia with the patient or authorized representative who has indicated his/her understanding and acceptance.     Plan Discussed with: CRNA and Surgeon  Anesthesia Plan Comments: (videolaryngoscope standby)        Anesthesia Quick Evaluation

## 2011-09-30 NOTE — Transfer of Care (Signed)
Immediate Anesthesia Transfer of Care Note  Patient: Tina [name]  Procedure(s) Performed:  MICRODISCECTOMY LUMBAR LAMINECTOMY - Right L4-5 Microdiscectomy using MIS approach  Patient Location: PACU  Anesthesia Type: General  Level of Consciousness: awake, oriented, sedated, patient cooperative and responds to stimulation  Airway & Oxygen Therapy: Patient Spontanous Breathing and Patient connected to face mask oxygen  Post-op Assessment: Report given to PACU RN, Post -op Vital signs reviewed and stable, Patient moving all extremities and Patient moving all extremities X 4  Post vital signs: Reviewed and stable  Complications: No apparent anesthesia complications

## 2011-09-30 NOTE — Anesthesia Postprocedure Evaluation (Signed)
  Anesthesia Post-op Note  Patient: Tina [name]  Procedure(s) Performed:  MICRODISCECTOMY LUMBAR LAMINECTOMY - Right L4-5 Microdiscectomy using MIS approach  Patient Location: PACU  Anesthesia Type: General  Level of Consciousness: awake, alert  and oriented  Airway and Oxygen Therapy: Patient Spontanous Breathing and Patient connected to nasal cannula oxygen  Post-op Pain: mild  Post-op Assessment: Post-op Vital signs reviewed, Patient's Cardiovascular Status Stable, Respiratory Function Stable, Patent Airway, No signs of Nausea or vomiting and Pain level controlled  Post-op Vital Signs: Reviewed and stable  Complications: No apparent anesthesia complications

## 2011-09-30 NOTE — Anesthesia Postprocedure Evaluation (Incomplete)
  Anesthesia Post-op Note  Patient: Tina [name]  Procedure(s) Performed:  MICRODISCECTOMY LUMBAR LAMINECTOMY - Right L4-5 Microdiscectomy using MIS approach  Patient Location: {PLACES; ANE POST:19477::"PACU"}  Anesthesia Type: {PROCEDURES; ANE POST ANESTHESIA TYPE:19480}  Level of Consciousness: {FINDINGS; ANE POST LEVEL OF CONSCIOUSNESS:19484}  Airway and Oxygen Therapy: {Exam; oxygen device:30095}  Post-op Pain: {Desc; pain severity:12299}  Post-op Assessment: {ASSESSMENT; ANE ZOXW:96045}  Post-op Vital Signs: {DESC; ANE POST WUJWJX:91478}  Complications: {FINDINGS; ANE POST COMPLICATIONS:19485}

## 2011-09-30 NOTE — H&P (Signed)
Tina [name] is an 60 y.o. female.   Chief Complaint: This 60 year old female complains of right LBP with radiation into the right posterior and lateral leg. HPI: This 60 year old female presents today with a 4 week history of increasing back and right leg pain. She has a history of previous lumbar laminectomy on the left side at L4-5. Has undergone a recent anterior cervical discectomy and fusion at the C67 level with good improvement in her cervical condition. Returned to work 5 weeks ago and has noted increasingly severe low back pain which over the last several weeks this began radiating into the right lower extremity. Initially treated for central mechanical back pain as her pain worsened and findings of radiculopathy became more apparent MRI scan was ordered. The study shows a disc herniation at the right L4-5 level with extruded fragment compressing the right L5 nerve. Patient's findings are consistent with right L5 radiculopathy secondary to disc herniation. Risks and benefits of conservative management versus operative treatment have been discussed with the patient and she prefers to undergo surgical excision of this disc fragment. Anesthesia fragment is free fragment extruded from the L4-5 level likelihood of improvement with conservative management it is less. It is expected that she has a 95-97% chance to get improvement in leg function relief like pain with surgical management. No bowel or bladder symptoms. She has noticed weakness in the right lower extremity numbness and paresthesias on the right lateral thigh calf into the right great toe.  Past Medical History  Diagnosis Date  . Bronchitis     hx  . Depression     Past Surgical History  Procedure Date  . Abdominal hysterectomy   . Back surgery   . Cervical disc surgery 12  . Knee arthroscopy     lft  . Carpal tunnel release     rt    History reviewed. No pertinent family history. Social History:  reports that she has been  smoking.  She does not have any smokeless tobacco history on file. She reports that she does not drink alcohol or use illicit drugs.  Allergies:  Allergies  Allergen Reactions  . Dilaudid (Hydromorphone Hcl) Hives  . Percocet (Oxycodone-Acetaminophen) Hives  . Tape Itching    Tape on iv    Medications Prior to Admission  Medication Dose Route Frequency Provider Last Rate Last Dose  . ceFAZolin (ANCEF) IVPB 2 g/50 mL premix  2 g Intravenous 60 min Pre-Op Kerrin Champagne, MD      . fentaNYL (SUBLIMAZE) injection 50-100 mcg  50-100 mcg Intravenous PRN Bedelia Person, MD      . lactated ringers infusion   Intravenous Continuous Bedelia Person, MD 50 mL/hr at 09/30/11 1122    . midazolam (VERSED) injection 1-2 mg  1-2 mg Intravenous PRN Bedelia Person, MD       No current outpatient prescriptions on file as of 09/30/2011.    No results found for this or any previous visit (from the past 48 hour(s)). No results found.  Review of Systems  Constitutional: Negative for fever, chills, weight loss, malaise/fatigue and diaphoresis.  HENT: Negative for hearing loss, ear pain, nosebleeds, congestion, sore throat, neck pain, tinnitus and ear discharge.   Eyes: Negative for blurred vision, double vision, photophobia, pain, discharge and redness.  Respiratory: Positive for cough. Negative for hemoptysis, sputum production, shortness of breath, wheezing and stridor.   Cardiovascular: Negative for chest pain, palpitations, orthopnea, claudication, leg swelling and PND.  Gastrointestinal: Negative for  heartburn, nausea, vomiting, abdominal pain, diarrhea, constipation, blood in stool and melena.  Genitourinary: Negative for dysuria, urgency, frequency, hematuria and flank pain.  Musculoskeletal: Positive for back pain. Negative for myalgias, joint pain and falls.  Skin: Negative for itching and rash.  Neurological: Positive for tingling, sensory change, focal weakness and weakness. Negative for dizziness, tremors,  speech change, seizures, loss of consciousness and headaches.  Endo/Heme/Allergies: Negative for environmental allergies and polydipsia. Does not bruise/bleed easily.  Psychiatric/Behavioral: Positive for depression. Negative for suicidal ideas, hallucinations, memory loss and substance abuse. The patient is nervous/anxious and has insomnia.     Blood pressure 147/85, pulse 109, temperature 98.2 F (36.8 C), temperature source Oral, resp. rate 20, SpO2 96.00%. Physical Exam  Constitutional: She is oriented to person, place, and time. She appears well-developed and well-nourished. No distress.  HENT:  Head: Normocephalic and atraumatic.  Right Ear: External ear normal.  Left Ear: External ear normal.  Nose: Nose normal.  Mouth/Throat: Oropharynx is clear and moist. No oropharyngeal exudate.  Eyes: Conjunctivae and EOM are normal. Pupils are equal, round, and reactive to light. Right eye exhibits no discharge. Left eye exhibits no discharge. No scleral icterus.  Neck: Neck supple. No JVD present. No tracheal deviation present. No thyromegaly present.  Cardiovascular: Normal rate, regular rhythm and normal heart sounds.  Exam reveals no gallop and no friction rub.   No murmur heard. Respiratory: Effort normal. No stridor. No respiratory distress. She has no wheezes. She has no rales. She exhibits no tenderness.  GI: Soft. Bowel sounds are normal. She exhibits no distension and no mass. There is no tenderness. There is no rebound and no guarding.  Musculoskeletal: She exhibits no edema and no tenderness.  Lymphadenopathy:    She has no cervical adenopathy.  Neurological: She is alert and oriented to person, place, and time. She displays abnormal reflex. No cranial nerve deficit. She exhibits normal muscle tone. Coordination normal.  Skin: Skin is warm and dry. No rash noted. She is not diaphoretic. No erythema. No pallor.  Psychiatric: She has a normal mood and affect. Her behavior is normal.  Judgment and thought content normal.  Positive SLR right 45 degrees, right foot DF weakness 4-/5, weak right EHL 5-/5  MRI right L4-5 HNP with extruded free fragment causing right L5 nerve compression.   Assessment/Plan Right L4-5 HNP with extruded free fragment causing right L5 nerve compression. Right L5 radiculopathy 4 months post op ACDF Depression H/o previous left L4-5 discectomy.  Plan: Right L4-5 MIS approach to microdisckectomy. Excision of free fragment. 24 hour observation. Risks and benefits of surgery discussed with patient and she has signed informed Consent.  Sherah Lund E 09/30/2011, 12:41 PM

## 2011-09-30 NOTE — Brief Op Note (Signed)
09/30/2011  5:24 PM  PATIENT:  Tina [name]  60 y.o. female  PRE-OPERATIVE DIAGNOSIS:  Right Lumbar 4-5 Herniated Nucleus Pulposus  POST-OPERATIVE DIAGNOSIS:  Right Lumbar 4-5 Herniated Nucleus Pulposus  PROCEDURE:  Procedure(s): MICRODISCECTOMY LUMBAR LAMINECTOMY RIGHT L4-5, REPAIR OF DURAL BLEB 2 X 4-0 NEUROLON SUTURES AND DURASEAL.  SURGEON:  Surgeon(s): Kerrin Champagne, MD   ASSISTANTS: none   ANESTHESIA:   local and general  EBL:  Total I/O In: 4150 [I.V.:3150; IV Piggyback:1000] Out: 900 [Urine:400; Blood:500]  BLOOD ADMINISTERED:none  DRAINS: Urinary Catheter (Foley)   LOCAL MEDICATIONS USED:  MARCAINE 0.5% WITH EPI 1/200,000 20CC CC  SPECIMEN:  No Specimen  DISPOSITION OF SPECIMEN:  N/A  COUNTS:  YES  TOURNIQUET:  * No tourniquets in log *  DICTATION: .Dragon Dictation  PLAN OF CARE: Admit for overnight observation  PATIENT DISPOSITION:  PACU - hemodynamically stable.   Delay start of Pharmacological VTE agent (>24hrs) due to surgical blood loss or risk of bleeding:  TO PREVENT EPIDURAL HEMATOMA

## 2011-09-30 NOTE — Transfer of Care (Signed)
Immediate Anesthesia Transfer of Care Note  Patient: Tina [name]  Procedure(s) Performed:  MICRODISCECTOMY LUMBAR LAMINECTOMY - Right L4-5 Microdiscectomy using MIS approach  Patient Location: PACU  Anesthesia Type: General  Level of Consciousness: awake, oriented, sedated and patient cooperative  Airway & Oxygen Therapy: Patient Spontanous Breathing and Patient connected to face mask oxygen  Post-op Assessment: Report given to PACU RN, Post -op Vital signs reviewed and stable, Patient moving all extremities and Patient moving all extremities X 4  Post vital signs: Reviewed and stable  Complications: No apparent anesthesia complications

## 2011-09-30 NOTE — H&P (Signed)
The patient has been re-examined, and the chart reviewed, and there have been no interval changes to the documented history and physical.    The risks, benefits, and alternatives have been discussed at length, and the patient is willing to proceed.   

## 2011-09-30 NOTE — Anesthesia Procedure Notes (Signed)
Procedure Name: Intubation Date/Time: 09/30/2011 12:40 PM Performed by: Ellin Goodie Pre-anesthesia Checklist: Patient identified, Emergency Drugs available, Suction available, Patient being monitored and Timeout performed Patient Re-evaluated:Patient Re-evaluated prior to inductionOxygen Delivery Method: Circle System Utilized Preoxygenation: Pre-oxygenation with 100% oxygen Intubation Type: IV induction Ventilation: Mask ventilation without difficulty Laryngoscope Size: Mac and 3 Grade View: Grade I Tube type: Oral Tube size: 7.5 mm Number of attempts: 1 Airway Equipment and Method: stylet Placement Confirmation: ETT inserted through vocal cords under direct vision,  positive ETCO2 and breath sounds checked- equal and bilateral Secured at: 22 cm Tube secured with: Tape Dental Injury: Teeth and Oropharynx as per pre-operative assessment

## 2011-10-01 ENCOUNTER — Encounter (HOSPITAL_COMMUNITY): Payer: Self-pay | Admitting: Specialist

## 2011-10-01 LAB — CBC
Hemoglobin: 11.3 g/dL — ABNORMAL LOW (ref 12.0–15.0)
MCH: 31.1 pg (ref 26.0–34.0)
MCHC: 33.7 g/dL (ref 30.0–36.0)
MCV: 92.3 fL (ref 78.0–100.0)
Platelets: 233 10*3/uL (ref 150–400)
RBC: 3.63 MIL/uL — ABNORMAL LOW (ref 3.87–5.11)

## 2011-10-01 MED ORDER — METHOCARBAMOL 500 MG PO TABS: 500.0000 mg | ORAL_TABLET | Freq: Four times a day (QID) | ORAL | Status: AC | PRN

## 2011-10-01 MED ORDER — HYDROCODONE-ACETAMINOPHEN 5-325 MG PO TABS: 1.0000 | ORAL_TABLET | ORAL | Status: AC | PRN

## 2011-10-01 NOTE — Discharge Summary (Signed)
Physician Discharge Summary  Patient ID: Tina [name] MRN: [id number] DOB/AGE: [birth year] 59 y.o.  Admit date: 09/30/2011 Discharge date: 10/01/2011  Admission Diagnoses:  Lumbar disc herniation with radiculopathy Right L4-5  Discharge Diagnoses:  Principal Problem:  *Lumbar disc herniation with radiculopathy right L4-5  Past Medical History  Diagnosis Date  . Bronchitis     hx  . Depression     Surgeries: Procedure(s): MICRODISCECTOMY LUMBAR LAMINECTOMY on 09/30/2011 right L4-5   Consultants (if any):  none  Discharged Condition: Improved  Hospital Course: Tina [name] is an 60 y.o. female who was admitted 09/30/2011 with a diagnosis of Lumbar disc herniation with radiculopathy and went to the operating room on 09/30/2011 and underwent the above named procedures.    She was given perioperative antibiotics:  Anti-infectives     Start     Dose/Rate Route Frequency Ordered Stop   09/30/11 2200   ceFAZolin (ANCEF) IVPB 1 g/50 mL premix        1 g 100 mL/hr over 30 Minutes Intravenous 3 times per day 09/30/11 2034 10/01/11 0604   09/29/11 1400   ceFAZolin (ANCEF) IVPB 2 g/50 mL premix        2 g 100 mL/hr over 30 Minutes Intravenous 60 min pre-op 09/29/11 1345 09/30/11 1239        .  She was given sequential compression devices, early ambulation, and chemoprophylaxis for DVT prophylaxis.  She benefited maximally from their hospital stay and there were no complications.   Bed rest for 12 hrs because of small dural tear.  When out of bed no headache or other problems.  Ambulated in hallway without difficultly.    Recent vital signs:  Filed Vitals:   10/01/11 0443  BP: 105/68  Pulse: 103  Temp: 97.5 F (36.4 C)  Resp: 18    Recent laboratory studies:  Lab Results  Component Value Date   HGB 11.3* 10/01/2011   HGB 15.1* 09/24/2011   HGB 15.0 06/23/2011   Lab Results  Component Value Date   WBC 13.1* 10/01/2011   PLT 233 10/01/2011   Lab Results    Component Value Date   INR 0.91 06/23/2011   Lab Results  Component Value Date   NA 139 09/24/2011   K 4.1 09/24/2011   CL 102 09/24/2011   CO2 29 09/24/2011   BUN 10 09/24/2011   CREATININE 0.78 09/24/2011   GLUCOSE 83 09/24/2011    Discharge Medications:   Current Discharge Medication List    START taking these medications   Details  HYDROcodone-acetaminophen (NORCO) 5-325 MG per tablet Take 1-2 tablets by mouth every 4 (four) hours as needed for pain. Qty: 60 tablet, Refills: 0    methocarbamol (ROBAXIN) 500 MG tablet Take 1 tablet (500 mg total) by mouth every 6 (six) hours as needed. Qty: 30 tablet, Refills: 0      CONTINUE these medications which have NOT CHANGED   Details  ALPRAZolam (XANAX) 0.5 MG tablet Take 0.5 mg by mouth 2 (two) times daily.      atorvastatin (LIPITOR) 10 MG tablet Take 10 mg by mouth daily.      calcium carbonate (OS-CAL) 600 MG TABS Take 600 mg by mouth daily.      desipramine (NORPRAMIN) 50 MG tablet Take 50 mg by mouth daily.      HYDROcodone-acetaminophen (NORCO) 7.5-325 MG per tablet Take 1 tablet by mouth every 6 (six) hours as needed. For pain     Multiple Vitamins-Minerals (MULTIVITAMINS  THER. W/MINERALS) TABS Take 1 tablet by mouth daily.      Nutritional Supplements (ESTROVEN PO) Take 1 tablet by mouth daily.      sertraline (ZOLOFT) 50 MG tablet Take 175 mg by mouth daily.      vitamin C (ASCORBIC ACID) 500 MG tablet Take 1,000 mg by mouth daily.          Diagnostic Studies: Mr Lumbar Spine Wo Contrast  09/17/2011  *RADIOLOGY REPORT*  Clinical Data: Chronic low back pain worsening over the past 2 weeks.  Right buttock pain radiating into the right leg.  MRI LUMBAR SPINE WITHOUT CONTRAST  Technique:  Multiplanar and multiecho pulse sequences of the lumbar spine were obtained without intravenous contrast.  Comparison: None.  Findings: Vertebral body height, signal and alignment are maintained.  The conus medullaris is normal  in signal and position. Imaged intra-abdominal contents are unremarkable.  T11-12:  The patient has a mild disc bulge and a small left foraminal protrusion.  The central canal and foramina remain open.  T12-L1:  This level is imaged in the sagittal plane only and unremarkable.  L1-2:  There is some facet arthropathy and a very mild disc bulge without central canal or foraminal stenosis.  L2-3:  There is some facet arthropathy and a mild disc bulge without central canal or foraminal narrowing.  L3-4:  Mild broad-based disc bulge and mild to moderate facet degenerative disease are identified.  There is mild central canal and bilateral foraminal narrowing.  L4-5:  Left laminotomy defect is identified.  The patient has a disc bulge with a superimposed right paracentral disc extrusion with caudal extension.  There is deformity the right aspect of the thecal sac and encroachment on the descending right L5 root.  L5-S1: Moderate to advanced bilateral facet degenerative disease and a mild broad-based disc bulge without central canal or foraminal narrowing.  IMPRESSION:  1.  Dominant finding is a right paracentral downturning disc extrusion at L4-5 deforming the right aspect of the thecal sac and encroaching on the descending right L5 root.  Left laminotomy defect at this level noted. 2.  Small left foraminal protrusion at T11-12 without foraminal stenosis. 3.  Mild central canal narrowing L3-4 due to disc bulging.  Original Report Authenticated By: Bernadene Bell. D'ALESSIO, M.D.   Dg Lumbar Spine 1 View  09/30/2011  *RADIOLOGY REPORT*  Clinical Data: L4-5 microdiskectomy  LUMBAR SPINE - 1 VIEW  Comparison: MRI 12/13  Findings: A single image shows a marker at the L4-5 foraminal level.  IMPRESSION: L4-5 localized  Original Report Authenticated By: Thomasenia Sales, M.D.   Dg C-arm 1-60 Min  09/30/2011  CLINICAL DATA: L4-5 microdiscectomy   C-ARM 1-60 MINUTES  Fluoroscopy was utilized by the requesting physician.  No  radiographic  interpretation.      Disposition: Home or Self Care  Discharge Orders    Future Orders Please Complete By Expires   Diet - low sodium heart healthy      Call MD / Call 911      Comments:   If you experience chest pain or shortness of breath, CALL 911 and be transported to the hospital emergency room.  If you develope a fever above 101 F, pus (Wisinski drainage) or increased drainage or redness at the wound, or calf pain, call your surgeon's office.   Constipation Prevention      Comments:   Drink plenty of fluids.  Prune juice may be helpful.  You may use a stool softener,  such as Colace (over the counter) 100 mg twice a day.  Use MiraLax (over the counter) for constipation as needed.   Increase activity slowly as tolerated      Weight Bearing as taught in Physical Therapy      Comments:   Use a walker or crutches as instructed.   Discharge instructions      Comments:   Walk as tolerated.  No bending, lifting or twisting.  Change dressing daily or as needed.  May shower in 3 to 4 days if no drainage.   Driving restrictions      Comments:   No driving   Lifting restrictions      Comments:   No lifting      Follow-up Information    Follow up with NITKA,JAMES E, MD. Make an appointment in 2 weeks.   Contact information:   [employer] [employer] [address] [place] [address] [phone]           Signed: Wende Neighbors 10/01/2011, 4:51 PM

## 2011-10-01 NOTE — Progress Notes (Signed)
Subjective: 1 Day Post-Op Procedure(s) (LRB): MICRODISCECTOMY LUMBAR LAMINECTOMY (Right) Patient reports pain as moderate.   Back pain only no leg pain this am.  Not oob yet and wants to get up.  Denies headache or nausea and vomiting Objective: Vital signs in last 24 hours: Temp:  [97.5 F (36.4 C)-98.7 F (37.1 C)] 97.5 F (36.4 C) (12/28 0443) Pulse Rate:  [88-109] 103  (12/28 0443) Resp:  [10-26] 18  (12/28 0443) BP: (94-146)/(55-77) 105/68 mmHg (12/28 0443) SpO2:  [89 %-100 %] 94 % (12/28 0443) Weight:  [121.564 kg (268 lb)] 268 lb (121.564 kg) (12/27 2029)  Intake/Output from previous day: 12/27 0701 - 12/28 0700 In: 4922.5 [I.V.:3822.5; IV Piggyback:1100] Out: 3400 [Urine:2900; Blood:500] Intake/Output this shift:     Basename 10/01/11 0540  HGB 11.3*    Basename 10/01/11 0540  WBC 13.1*  RBC 3.63*  HCT 33.5*  PLT 233   No results found for this basename: NA:2,K:2,CL:2,CO2:2,BUN:2,CREATININE:2,GLUCOSE:2,CALCIUM:2 in the last 72 hours No results found for this basename: LABPT:2,INR:2 in the last 72 hours  Neurovascular intact Dorsiflexion/Plantar flexion intact Incision: dressing C/D/I  Assessment/Plan: 1 Day Post-Op Procedure(s) (LRB): MICRODISCECTOMY LUMBAR LAMINECTOMY (Right) Up with therapy  If she tolerates activity well will dc later today.  Using po analgesics.  If headache or other problems with activity will remain at bedrest  Tina [name] M 10/01/2011, 10:45 AM

## 2011-10-01 NOTE — Discharge Summary (Signed)
Patient examined and summary reviewed.

## 2011-11-01 ENCOUNTER — Ambulatory Visit: Payer: PRIVATE HEALTH INSURANCE | Attending: Specialist | Admitting: Physical Therapy

## 2011-11-01 DIAGNOSIS — IMO0001 Reserved for inherently not codable concepts without codable children: Secondary | ICD-10-CM | POA: Insufficient documentation

## 2011-11-01 DIAGNOSIS — M2550 Pain in unspecified joint: Secondary | ICD-10-CM | POA: Insufficient documentation

## 2011-11-01 DIAGNOSIS — M6281 Muscle weakness (generalized): Secondary | ICD-10-CM | POA: Insufficient documentation

## 2011-11-03 ENCOUNTER — Ambulatory Visit: Payer: PRIVATE HEALTH INSURANCE | Admitting: Physical Therapy

## 2011-11-08 ENCOUNTER — Encounter: Payer: PRIVATE HEALTH INSURANCE | Admitting: Physical Therapy

## 2011-11-10 ENCOUNTER — Ambulatory Visit: Payer: PRIVATE HEALTH INSURANCE | Admitting: Physical Therapy

## 2011-11-11 ENCOUNTER — Ambulatory Visit: Payer: PRIVATE HEALTH INSURANCE | Attending: Specialist | Admitting: Physical Therapy

## 2011-11-11 DIAGNOSIS — IMO0001 Reserved for inherently not codable concepts without codable children: Secondary | ICD-10-CM | POA: Insufficient documentation

## 2011-11-11 DIAGNOSIS — M2550 Pain in unspecified joint: Secondary | ICD-10-CM | POA: Insufficient documentation

## 2011-11-11 DIAGNOSIS — M6281 Muscle weakness (generalized): Secondary | ICD-10-CM | POA: Insufficient documentation

## 2011-11-15 ENCOUNTER — Ambulatory Visit: Payer: PRIVATE HEALTH INSURANCE | Admitting: Physical Therapy

## 2011-11-18 ENCOUNTER — Encounter: Payer: PRIVATE HEALTH INSURANCE | Admitting: Physical Therapy

## 2011-11-22 ENCOUNTER — Encounter: Payer: PRIVATE HEALTH INSURANCE | Admitting: Physical Therapy

## 2011-11-25 ENCOUNTER — Encounter: Payer: PRIVATE HEALTH INSURANCE | Admitting: Physical Therapy

## 2011-11-29 ENCOUNTER — Encounter: Payer: PRIVATE HEALTH INSURANCE | Admitting: Physical Therapy

## 2011-12-02 ENCOUNTER — Encounter: Payer: PRIVATE HEALTH INSURANCE | Admitting: Physical Therapy

## 2013-02-16 ENCOUNTER — Ambulatory Visit
Admission: RE | Admit: 2013-02-16 | Discharge: 2013-02-16 | Disposition: A | Discharge status: Home / Self Care | Payer: PRIVATE HEALTH INSURANCE | Source: Ambulatory Visit | Attending: Physician Assistant | Admitting: Physician Assistant

## 2013-02-16 ENCOUNTER — Other Ambulatory Visit: Payer: Self-pay | Admitting: Physician Assistant

## 2013-02-16 DIAGNOSIS — R05 Cough: Secondary | ICD-10-CM

## 2013-02-16 DIAGNOSIS — R059 Cough, unspecified: Secondary | ICD-10-CM

## 2013-05-03 ENCOUNTER — Other Ambulatory Visit (HOSPITAL_COMMUNITY): Payer: Self-pay | Admitting: Psychiatry

## 2013-07-02 ENCOUNTER — Other Ambulatory Visit (HOSPITAL_COMMUNITY): Payer: Self-pay | Admitting: Psychiatry

## 2013-07-07 ENCOUNTER — Other Ambulatory Visit (HOSPITAL_COMMUNITY): Payer: Self-pay | Admitting: Psychiatry

## 2015-10-11 ENCOUNTER — Other Ambulatory Visit (HOSPITAL_COMMUNITY): Payer: Self-pay | Admitting: Psychiatry

## 2017-08-19 ENCOUNTER — Telehealth (INDEPENDENT_AMBULATORY_CARE_PROVIDER_SITE_OTHER): Payer: Self-pay | Admitting: Specialist

## 2017-08-19 NOTE — Telephone Encounter (Signed)
On cancellation list.

## 2017-08-19 NOTE — Telephone Encounter (Signed)
[name],Tina Ann 1951/05/26  Please call pt if earlier appt opens up

## 2017-09-28 ENCOUNTER — Ambulatory Visit (INDEPENDENT_AMBULATORY_CARE_PROVIDER_SITE_OTHER): Payer: 59 | Admitting: Specialist

## 2017-09-28 ENCOUNTER — Ambulatory Visit (INDEPENDENT_AMBULATORY_CARE_PROVIDER_SITE_OTHER): Payer: 59

## 2017-09-28 ENCOUNTER — Encounter (INDEPENDENT_AMBULATORY_CARE_PROVIDER_SITE_OTHER): Payer: Self-pay | Admitting: Specialist

## 2017-09-28 VITALS — BP 136/61 | HR 97 | Temp 98.2°F | Ht 63.0 in | Wt 270.0 lb

## 2017-09-28 DIAGNOSIS — M79641 Pain in right hand: Secondary | ICD-10-CM | POA: Diagnosis not present

## 2017-09-28 DIAGNOSIS — M25641 Stiffness of right hand, not elsewhere classified: Secondary | ICD-10-CM | POA: Diagnosis not present

## 2017-09-28 MED ORDER — DICLOFENAC SODIUM 3 % TD GEL: 2.0000 g | Freq: Four times a day (QID) | TRANSDERMAL | 2 refills | Status: DC

## 2017-09-28 MED ORDER — MELOXICAM 15 MG PO TABS: 15.0000 mg | ORAL_TABLET | Freq: Every day | ORAL | 3 refills | Status: DC

## 2017-09-28 NOTE — Progress Notes (Addendum)
Office Visit Note   Patient: Tina [name]           Date of Birth: [birth year]           MRN: [id number] Visit Date: 09/28/2017              Requested by: Carol Ada, [place], [place] [address] PCP: Carol Ada, MD   Assessment & Plan: Visit Diagnoses:  1. Pain in right hand   2. Joint stiffness of hand, right    66 year old right handed female with increasing stiffness right hand. 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 Clinically unable to reach right index and long finger tips to the palm, no triggering though she describes this in the past. Plain radiographs negative for deformity or erosive changes of the joints. Recommend exercises with OT and home and use of transdermal and oral NSAIDs. As unilateral symptoms less likely a systemic inflamatory process.  Plan: Exercises for the right hand, Start meloxicam 15 mg once a day with meal or snack Use transdermal diclofenac during the day. See an Occupational therapist for hand exercises and parafin baths.   Follow-Up Instructions: Return in about 4 weeks (around 10/26/2017).   Orders:  Orders Placed This Encounter  Procedures  . XR Hand Complete Right  . Ambulatory referral to Occupational Therapy   Meds ordered this encounter  Medications  . Diclofenac Sodium 3 % GEL    Sig: Place 2 g onto the skin QID.    Dispense:  3 Tube    Refill:  2  . meloxicam (MOBIC) 15 MG tablet    Sig: Take 1 tablet (15 mg total) by mouth daily.    Dispense:  30 tablet    Refill:  3      Procedures: No procedures performed   Clinical Data: No additional findings.   Subjective: Chief Complaint  Patient presents with  . 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 She is experiencing right hand stiffness and difficulty making a fist. In the past she has had catching with flexion  and extension of the right hand digits. She notices swelling in the palmar aspect of the right hand in the area of the distal transverse palmar  Crease in line with the long finger. Occasional burniong sensation in the palmar right hand about the long finger MCP. History of previous CTS post release years ago.     Review of Systems  Constitutional: Negative.   HENT: Negative for congestion, postnasal drip, rhinorrhea, sinus pressure, sinus pain and sneezing.   Eyes: Negative.   Respiratory: Negative.   Cardiovascular: Negative.   Gastrointestinal: Negative.   Endocrine: Negative.   Genitourinary: Negative.   Musculoskeletal: Positive for arthralgias and joint swelling.  Skin: Negative.   Allergic/Immunologic: Positive for environmental allergies.  Neurological: Negative.   Hematological: Negative.   Psychiatric/Behavioral: Negative.      Objective: Vital Signs: BP 136/61 (BP Location: Left Arm, Patient Position: Sitting, Cuff Size: Normal)   Pulse 97   Temp 98.2 F (36.8 C)   Ht 5\' 3"  (1.6 m)   Wt 270 lb (122.5 kg)   SpO2 97%   BMI 47.83 kg/m   Physical Exam  Constitutional: She is oriented to person, place, and time. She appears well-developed and well-nourished.  HENT:  Head: Normocephalic and atraumatic.  Eyes: EOM are normal. Pupils are equal, round, and reactive to light.  Neck: Normal range of motion. Neck supple.  Pulmonary/Chest: Effort normal and breath sounds normal.  Abdominal: Soft. Bowel sounds are normal.  Musculoskeletal: Normal range of motion.  Neurological: She is alert and oriented to person, place, and time.  Skin: Skin is warm and dry.  Psychiatric: She has a normal mood and affect. Her behavior is normal. Judgment and thought content normal.    Right Hand Exam   Tenderness  The patient is experiencing tenderness in the palmer area.  Range of Motion  Wrist  Extension: normal  Flexion: normal  Pronation: normal  Supination: normal  Hand    MP Thumb: normal  MP Index: 70  MP Middle: 70  MP Ring: 90  MP Little: 90  PIP Index: 90  PIP Middle: 80  PIP Ring: 90  PIP Little: normal  DIP Thumb: normal  DIP Index: normal  DIP Middle: normal  DIP Ring: normal  DIP Little: normal   Muscle Strength  The patient has normal right wrist strength. Wrist extension: 5/5  Wrist flexion: 5/5  Grip: 4/5   Tests  Phalen's Sign: negative Tinel's sign (median nerve): negative Finkelstein's test: negative  Other  Erythema: absent Scars: present Sensation: normal Pulse: present  Comments:  Mild prominence of th palmar right long finger MCP area.       Specialty Comments:  No specialty comments available.  Imaging: Xr Hand Complete Right  Result Date: 09/28/2017 AP, lateral and oblique radiographs of the right hand show no fracture dislocation or subluxation, no significant joint line narrowing.     PMFS History: Patient Active Problem List   Diagnosis Date Noted  . 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 Bronchitis    hx  . Depression     History reviewed. No pertinent family history.  Past Surgical History:  Procedure Laterality Date  . ABDOMINAL HYSTERECTOMY    . BACK SURGERY    . CARPAL TUNNEL RELEASE     rt  . CERVICAL DISC SURGERY  12  . KNEE ARTHROSCOPY     lft  . LUMBAR LAMINECTOMY  09/30/2011   Procedure: MICRODISCECTOMY LUMBAR LAMINECTOMY;  Surgeon: Jessy Oto, MD;  Location: [facility];  Service: Orthopedics;  Laterality: Right;  Right L4-5 Microdiscectomy using MIS approach   Social History   Occupational History  . Not on file  Tobacco Use  . Smoking status: Current Every Day Smoker    Packs/day: 1.00  . Smokeless tobacco: Never Used  Substance and Sexual Activity  . Alcohol use: No  . Drug use: No  . Sexual activity: Not on file    Comment: hysterectomy

## 2017-09-28 NOTE — Patient Instructions (Signed)
Exercises for the right hand, Start meloxicam 15 mg once a day with meal or snack Use transdermal diclofenac during the day. See an Occupational therapist for hand exercises and parafin baths.

## 2017-10-28 ENCOUNTER — Ambulatory Visit (INDEPENDENT_AMBULATORY_CARE_PROVIDER_SITE_OTHER): Payer: 59 | Admitting: Specialist

## 2018-04-19 ENCOUNTER — Ambulatory Visit: Payer: PRIVATE HEALTH INSURANCE | Admitting: Sports Medicine

## 2018-04-19 ENCOUNTER — Encounter: Payer: Self-pay | Admitting: Sports Medicine

## 2018-04-19 VITALS — BP 148/99 | Ht 63.0 in | Wt 270.0 lb

## 2018-04-19 DIAGNOSIS — M6530 Trigger finger, unspecified finger: Secondary | ICD-10-CM | POA: Diagnosis not present

## 2018-04-19 MED ORDER — TRIAMCINOLONE ACETONIDE 10 MG/ML IJ SUSP
2.5000 mg | Freq: Once | INTRAMUSCULAR | Status: AC
  Administered 2018-04-19: 2.5 mg via INTRA_ARTICULAR

## 2018-04-19 NOTE — Progress Notes (Signed)
Subjective:    Patient ID: Tina [name], female    DOB: [birth year], 67 y.o.   MRN: [id number]  CC: L hand pain  HPI: Patient is a 67yo F who presents to the clinic with the chief complaint of L hand and third finger pain. Symptoms have been present for the past 7-8 months and have persisted. Initially, she reported pain and locking at the base of her third finger to her PCP. An x-ray was performed which demonstrated no acute abnormalities. She tried 15mg  Meloxicam and topical NSAIDs without any relief. Since then, her pain has persisted. Her finger locks on her 1-2 times daily. Her L 3rd finger also intermittently swells on her. Denies any numbness or tingling. Denies any loss of grip strength or weakness in wrist flexion or extension. No injury that she knows of. She works as a Merchandiser, retail and types for a living. She also does yard work which exacerbates her symptoms. No elbow pain or shoulder pain.  Traumatic: No Location: L third finger base  Quality: Sharp   Previous Interventions Tried: as above  Past Injuries: none Past Surgeries: Carpal tunnel release on R hand Smoking: non Family Hx: noncontributory  ROS: Per HPI; in addition no fever, no rash, no additional weakness, no additional numbness, no additional paresthesias, and no additional falls/injury.   Objective: BP (!) 148/99   Ht 5\' 3"  (1.6 m)   Wt 270 lb (122.5 kg)   BMI 47.83 kg/m  Gen: R-Hand Dominant. NAD, well groomed, a/o x3, normal affect.  CV: Well-perfused. Warm.  Resp: Non-labored.  Neuro: Sensation intact throughout. No gross coordination deficits.  MSK: No acute abnormalities of L shoulder, L elbow, L wrist. Inspection of L third finger base demonstrates popping with finger flexion. Tenderness to palpation over A1 pulley with a palpable nodule and thickening. Full ROM in third finger F/E however painful with flexion. Strength 5/5 in finger flexion/extension. Allens test positive. Cap refill <2 sec. R radial  pulse 2/4. Sensation intact light touch C5-T1. Neg Tinel's. Neg Carpal compression. No acute abnormalities of R hand. Prior surgical incision over volar wrist noted. Neg Tinel's. No evidence of digit abnormalities on R. Skin: No rashes or skin lesions Gait: Nonpathologic posture, gait normal  Assessment and Plan:  1. L third digit, trigger finger  Given the patient's history and physical exam, I do believe her pain is best described by a trigger finger. I did review her x-rays from 2018 which demonstrate no significant arthritic joints. Given her tenderness to palpation and frequent locking, I am recommend a corticosteroid injection of the A1 pulley system. After the risks and benefits of the procedure were explained in depth, the patient gave written consent for a steroid injection. The wound was sterilized using an alcohol swab and 10mg  of Depo-medrol and 1/2 cc of 1% lidocaine w/o epi were placed in the A1 pulley. Proper positioning was confirmed with palpation. Patient tolerated the procedure well. Post-injection instructions were given as well as education on trigger finger. Patient may use two band aids as a home-made splint at night around her PIP joint to prevent locking or trigger while sleeping. F/u in 4-6 weeks or sooner if needed. I did discuss definitive treatment of A1 pulley release if 1 or two injections do not resolve her issue. She may use ice tonight post injection to help with transient pain. She was discharged in stable condition.    [facility] Fellow 04/19/2018 4:37 PM

## 2018-04-19 NOTE — Patient Instructions (Signed)
Trigger Finger  Injection of steroid and numbing medicine placed in the tendon sheath to treat a trigger finger. I discussed a night time splint with bandaids to assist with locking of the finger. F/u in 6 weeks or sooner if needed. You may take oral or topical anti-inflammatories if needed. Please call with questions. Use ice as needed.

## 2018-05-31 ENCOUNTER — Ambulatory Visit: Payer: Self-pay | Admitting: Sports Medicine

## 2018-08-09 ENCOUNTER — Encounter (INDEPENDENT_AMBULATORY_CARE_PROVIDER_SITE_OTHER): Payer: Self-pay | Admitting: Surgery

## 2018-08-09 ENCOUNTER — Ambulatory Visit (INDEPENDENT_AMBULATORY_CARE_PROVIDER_SITE_OTHER): Payer: 59

## 2018-08-09 ENCOUNTER — Ambulatory Visit (INDEPENDENT_AMBULATORY_CARE_PROVIDER_SITE_OTHER): Payer: 59 | Admitting: Surgery

## 2018-08-09 DIAGNOSIS — M4726 Other spondylosis with radiculopathy, lumbar region: Secondary | ICD-10-CM | POA: Diagnosis not present

## 2018-08-09 DIAGNOSIS — M545 Low back pain: Secondary | ICD-10-CM

## 2018-08-09 DIAGNOSIS — M79605 Pain in left leg: Secondary | ICD-10-CM

## 2018-08-09 DIAGNOSIS — I739 Peripheral vascular disease, unspecified: Secondary | ICD-10-CM | POA: Diagnosis not present

## 2018-08-09 NOTE — Progress Notes (Signed)
Office Visit Note   Patient: Tina [name]           Date of Birth: 1951-08-14           MRN: [id number] Visit Date: 08/09/2018              Requested by: Carol Ada, [place], [place] [address] PCP: Carol Ada, MD   Assessment & Plan: Visit Diagnoses:  1. Low back pain radiating to left lower extremity   2. Claudication ([place])   3. Other spondylosis with radiculopathy, lumbar region     Plan: With patient's worsening low back pain, neurogenic claudication and feeling of leg weakness I recommend getting lumbar spine MRI with contrast to rule out HNP/stenosis.  She is had previous surgery by Dr. Louanne Skye in 2012.  I am unable to try conservative treatment with Medrol Dosepak taper since she had an adverse reaction that required emergency room visit.  Will schedule her for formal PT but with the symptoms that she is describing I do not feel that this will be of any great benefit.  With long history of smoking and current symptoms also recommend getting arterial studies to rule out peripheral vascular disease.  Patient did have a fair amount of abdominal aorta calcification seen on x-ray and this was reviewed.  Follow-Up Instructions: Return in about 3 weeks (around 08/30/2018) for With Dr. Louanne Skye to review lumbar MRI and ABIs.   Orders:  Orders Placed This Encounter  Procedures  . XR Lumbar Spine Complete W/Bend  . MR LUMBAR SPINE W CONTRAST   No orders of the defined types were placed in this encounter.     Procedures: No procedures performed   Clinical Data: No additional findings.   Subjective: Chief Complaint  Patient presents with  . Lower Back - Pain  . Left Leg - Numbness    HPI 67 year old Tina [name] female comes in with complaints of worsening low back pain, neurogenic claudication, bilateral leg cramping and weakness.  Patient is status post right L4-5 microdiscectomy by Dr. Basil Dess 2012.  Off-and-on back pain for several years  but current symptoms worsening x1 year at least.  Walking distances have greatly decreased.  Further she walks back pain increases in bilateral legs feel crampy, achy and weak.  At times even walking up a curb legs feel weak.  Denies bowel or bladder incontinence.  Thinks left leg may be worse than right at times.  Patient admits to smoking since age of 33 and currently smokes 1 pack/day.  Patient states that she is unable to take oral prednisone since this caused a significant mood change that required her to be seen in the emergency room. Review of Systems No current cardiac pulmonary GI GU issues  Objective: Vital Signs: There were no vitals taken for this visit.  Physical Exam  Constitutional: She is oriented to person, place, and time. No distress.  HENT:  Head: Normocephalic and atraumatic.  Eyes: Pupils are equal, round, and reactive to light. EOM are normal.  Pulmonary/Chest: No respiratory distress.  Musculoskeletal:  Gait is somewhat antalgic.  Decreased lumbar flexion extension due to pain.  Positive lumbar paraspinal tenderness/spasm.  Positive bilateral sciatic notch tenderness.  She is mildly tender over the bilateral hip greater trochanter bursa.  Negative logroll bilateral hips.  Negative straight leg raise.  No focal motor deficits.  Pedal pulses bilaterally are diminished.  Bilateral calves nontender.  Neurological: She is alert and oriented to person,  place, and time.  Skin: Skin is warm and dry.  Psychiatric: She has a normal mood and affect.    Ortho Exam  Specialty Comments:  No specialty comments available.  Imaging: Xr Lumbar Spine Complete W/bend  Result Date: 08/09/2018 X-rays lumbar spine show multilevel lumbar spondylosis.  Most of them finding at L4-5 L5-S1 with disc space collapse, spurs and facet arthropathy.  A few millimeters of L3 retrolisthesis on L4.    PMFS History: Patient Active Problem List   Diagnosis Date Noted  . Lumbar disc herniation  with radiculopathy 09/30/2011    Class: Acute   Past Medical History:  Diagnosis Date  . Bronchitis    hx  . Depression     History reviewed. No pertinent family history.  Past Surgical History:  Procedure Laterality Date  . ABDOMINAL HYSTERECTOMY    . BACK SURGERY    . CARPAL TUNNEL RELEASE     rt  . CERVICAL DISC SURGERY  12  . KNEE ARTHROSCOPY     lft  . LUMBAR LAMINECTOMY  09/30/2011   Procedure: MICRODISCECTOMY LUMBAR LAMINECTOMY;  Surgeon: Jessy Oto, MD;  Location: [facility];  Service: Orthopedics;  Laterality: Right;  Right L4-5 Microdiscectomy using MIS approach   Social History   Occupational History  . Not on file  Tobacco Use  . Smoking status: Current Every Day Smoker    Packs/day: 1.00  . Smokeless tobacco: Never Used  Substance and Sexual Activity  . Alcohol use: No  . Drug use: No  . Sexual activity: Not on file    Comment: hysterectomy

## 2018-08-17 ENCOUNTER — Ambulatory Visit (HOSPITAL_COMMUNITY)
Admission: RE | Admit: 2018-08-17 | Discharge: 2018-08-17 | Disposition: A | Discharge status: Home / Self Care | Payer: Managed Care, Other (non HMO) | Source: Ambulatory Visit | Attending: Surgery | Admitting: Surgery

## 2018-08-17 DIAGNOSIS — I739 Peripheral vascular disease, unspecified: Secondary | ICD-10-CM | POA: Diagnosis present

## 2018-08-17 NOTE — Progress Notes (Signed)
VASCULAR LAB PRELIMINARY  ARTERIAL  ABI completed:ABIs are within normal limits.    RIGHT    LEFT    PRESSURE WAVEFORM  PRESSURE WAVEFORM  BRACHIAL 180 T BRACHIAL 173 T  DP 170 T DP 204 T  AT   AT    PT 187 T PT 173 T  PER   PER    GREAT TOE  NA GREAT TOE  NA    RIGHT LEFT  ABI 1.04 1.13     Rebeckah Masih, RVT 08/17/2018, 11:30 AM

## 2018-08-26 ENCOUNTER — Ambulatory Visit
Admission: RE | Admit: 2018-08-26 | Discharge: 2018-08-26 | Disposition: A | Discharge status: Home / Self Care | Payer: Managed Care, Other (non HMO) | Source: Ambulatory Visit | Attending: Surgery | Admitting: Surgery

## 2018-08-26 DIAGNOSIS — I739 Peripheral vascular disease, unspecified: Secondary | ICD-10-CM

## 2018-08-26 DIAGNOSIS — M4726 Other spondylosis with radiculopathy, lumbar region: Secondary | ICD-10-CM

## 2018-08-26 MED ORDER — GADOBENATE DIMEGLUMINE 529 MG/ML IV SOLN
20.0000 mL | Freq: Once | INTRAVENOUS | Status: AC | PRN
  Administered 2018-08-26: 20 mL via INTRAVENOUS

## 2018-08-28 ENCOUNTER — Ambulatory Visit: Payer: Managed Care, Other (non HMO) | Attending: Surgery | Admitting: Physical Therapy

## 2018-08-28 ENCOUNTER — Other Ambulatory Visit: Payer: Self-pay

## 2018-08-28 ENCOUNTER — Encounter: Payer: Self-pay | Admitting: Physical Therapy

## 2018-08-28 DIAGNOSIS — M62838 Other muscle spasm: Secondary | ICD-10-CM

## 2018-08-28 DIAGNOSIS — M5442 Lumbago with sciatica, left side: Secondary | ICD-10-CM | POA: Diagnosis present

## 2018-08-28 DIAGNOSIS — M5441 Lumbago with sciatica, right side: Secondary | ICD-10-CM | POA: Diagnosis present

## 2018-08-28 DIAGNOSIS — R262 Difficulty in walking, not elsewhere classified: Secondary | ICD-10-CM | POA: Diagnosis present

## 2018-08-28 NOTE — Therapy (Signed)
[facility] [address] [place], [place], [address] Phone: [phone]   Fax:  [phone]  Physical Therapy Evaluation  Patient Details  Name: Tina [name] MRN: [id number] Date of Birth: Feb 17, 1951 Referring Provider (PT): Benjiman Core PA   Encounter Date: 08/28/2018  PT End of Session - 08/28/18 1723    Visit Number  1    Date for PT Re-Evaluation  10/28/18    PT Start Time  1701    PT Stop Time  1744    PT Time Calculation (min)  43 min    Activity Tolerance  Patient tolerated treatment well    Behavior During Therapy  [facility] for tasks assessed/performed       Past Medical History:  Diagnosis Date  . Bronchitis    hx  . Depression     Past Surgical History:  Procedure Laterality Date  . ABDOMINAL HYSTERECTOMY    . BACK SURGERY    . CARPAL TUNNEL RELEASE     rt  . CERVICAL DISC SURGERY  12  . KNEE ARTHROSCOPY     lft  . LUMBAR LAMINECTOMY  09/30/2011   Procedure: MICRODISCECTOMY LUMBAR LAMINECTOMY;  Surgeon: Jessy Oto, MD;  Location: [facility];  Service: Orthopedics;  Laterality: Right;  Right L4-5 Microdiscectomy using MIS approach    There were no vitals filed for this visit.   Subjective Assessment - 08/28/18 1702    Subjective  Patient reports that she has had some back pain since she was a child, reports that she has had 3 lumbar surgeries, reports that the last lumbar surgery was in 2012.  Reports over the past several years the back has returned she does report some right buttock pain and bilateral leg numbness, MRI showed some DDD, and some stenosis    Pertinent History  Reports that she has had 3 lumbar surgeries, cervical ACDF    Limitations  Sitting;Standing;Lifting;House hold activities    Patient Stated Goals  have less pain    Currently in Pain?  Yes    Pain Score  6     Pain Location  Back    Pain Orientation  Lower    Pain Descriptors / Indicators  Aching    Pain Type  Acute pain     Pain Radiating Towards  pain in the right buttock, bilateral leg numbness    Pain Onset  More than a month ago    Pain Frequency  Constant    Aggravating Factors   stairs, walking, standing pain up to 10/10    Pain Relieving Factors  lie down, rest reports at best pain a 3/10    Effect of Pain on Daily Activities  reports that it limits everything         [facility] PT Assessment - 08/28/18 0001      Assessment   Medical Diagnosis  LBP    Referring Provider (PT)  Benjiman Core PA    Onset Date/Surgical Date  07/28/18    Prior Therapy  no      Precautions   Precautions  None      Balance Screen   Has the patient fallen in the past 6 months  No    Has the patient had a decrease in activity level because of a fear of falling?   No    Is the patient reluctant to leave their home because of a fear of falling?   No  Home Environment   Additional Comments  3 steps into, does her own housework, does some yardsork      Prior Function   Level of Independence  Independent    Vocation  Full time employment    Vocation Requirements  sitting at computer, customer service    Leisure  no exercise      Posture/Postural Control   Posture Comments  very fwd head and rounded shoulders      ROM / Strength   AROM / PROM / Strength  AROM;Strength      AROM   Overall AROM Comments  Lumbar ROM decreased 50% for flexion, other motions decreased 75% with pain in the low back, more on the right      Strength   Overall Strength Comments  4/5 for the LE's      Flexibility   Soft Tissue Assessment /Muscle Length  yes    Hamstrings  tight    Quadriceps  very tight    ITB  tight    Piriformis  tight with some pain      Palpation   Palpation comment  tight in the rhomboid to lumbar area, she was very tender in the lower lumbar and the upper buttocks                Objective measurements completed on examination: See above findings.      [facility] Adult PT Treatment/Exercise -  08/28/18 0001      Modalities   Modalities  Electrical Stimulation;Moist Heat      Moist Heat Therapy   Number Minutes Moist Heat  15 Minutes    Moist Heat Location  Lumbar Spine      Electrical Stimulation   Electrical Stimulation Location  lumbar     Electrical Stimulation Action  IFC    Electrical Stimulation Parameters  sitting    Electrical Stimulation Goals  Pain             PT Education - 08/28/18 1722    Education Details  WMS flexion exercises and piriformis stretches    Person(s) Educated  Patient    Methods  Explanation    Comprehension  Verbalized understanding       PT Short Term Goals - 08/28/18 1726      PT SHORT TERM GOAL #1   Title  indepednent with initial HEP    Time  2    Period  Weeks    Status  New        PT Long Term Goals - 08/28/18 1726      PT LONG TERM GOAL #1   Title  understand proper posture and body mechanics    Time  8    Period  Weeks    Status  New      PT LONG TERM GOAL #2   Title  increase lumbar ROM 25%    Time  8    Period  Weeks    Status  New      PT LONG TERM GOAL #3   Title  decrease pain 25%    Time  8    Period  Weeks    Status  New      PT LONG TERM GOAL #4   Title  tolerate walking 1000 feet without pain > 7/10    Time  8    Period  Weeks    Status  New  Plan - 08/28/18 1723    Clinical Impression Statement  Patient reports past lumbar surgeries, reports back pain since she was a child.  She reports that the most recent lumbar surgery was in 2012.  She reports that over the past year she has had increasing LBP and with some leg pain and numbness.  MRI showed DDD and some stenosis with bone spurs.  She has very poor posture, her job is sitting, she has decreased flexibility    History and Personal Factors relevant to plan of care:  past cervical and lumbar surgeries    Clinical Presentation  Stable    Clinical Decision Making  Low    Rehab Potential  Fair    PT Frequency  2x /  week    PT Duration  8 weeks    PT Treatment/Interventions  ADLs/Self Care Home Management;Cryotherapy;Electrical Stimulation;Moist Heat;Traction;Ultrasound;Therapeutic exercise;Therapeutic activities;Patient/family education;Manual techniques;Dry needling    PT Next Visit Plan  start gentle exercises    Consulted and Agree with Plan of Care  Patient       Patient will benefit from skilled therapeutic intervention in order to improve the following deficits and impairments:  Decreased range of motion, Increased muscle spasms, Decreased activity tolerance, Pain, Improper body mechanics, Impaired flexibility, Decreased mobility, Decreased strength, Postural dysfunction  Visit Diagnosis: Acute bilateral low back pain with bilateral sciatica - Plan: PT plan of care cert/re-cert  Other muscle spasm - Plan: PT plan of care cert/re-cert  Difficulty in walking, not elsewhere classified - Plan: PT plan of care cert/re-cert     Problem List Patient Active Problem List   Diagnosis Date Noted  . 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

## 2018-09-11 ENCOUNTER — Ambulatory Visit: Payer: Managed Care, Other (non HMO) | Admitting: Physical Therapy

## 2018-09-18 ENCOUNTER — Encounter: Payer: Managed Care, Other (non HMO) | Admitting: Physical Therapy

## 2018-09-21 ENCOUNTER — Ambulatory Visit (INDEPENDENT_AMBULATORY_CARE_PROVIDER_SITE_OTHER): Payer: 59 | Admitting: Specialist

## 2018-09-29 ENCOUNTER — Ambulatory Visit (INDEPENDENT_AMBULATORY_CARE_PROVIDER_SITE_OTHER): Payer: 59 | Admitting: Specialist

## 2018-10-16 ENCOUNTER — Encounter (INDEPENDENT_AMBULATORY_CARE_PROVIDER_SITE_OTHER): Payer: Self-pay | Admitting: Specialist

## 2018-10-16 ENCOUNTER — Ambulatory Visit (INDEPENDENT_AMBULATORY_CARE_PROVIDER_SITE_OTHER): Payer: 59 | Admitting: Specialist

## 2018-10-16 VITALS — BP 148/99 | HR 85 | Ht 63.0 in | Wt 270.0 lb

## 2018-10-16 DIAGNOSIS — M5136 Other intervertebral disc degeneration, lumbar region: Secondary | ICD-10-CM

## 2018-10-16 DIAGNOSIS — R202 Paresthesia of skin: Secondary | ICD-10-CM

## 2018-10-16 DIAGNOSIS — R200 Anesthesia of skin: Secondary | ICD-10-CM | POA: Diagnosis not present

## 2018-10-16 DIAGNOSIS — M48062 Spinal stenosis, lumbar region with neurogenic claudication: Secondary | ICD-10-CM | POA: Diagnosis not present

## 2018-10-16 MED ORDER — DICLOFENAC SODIUM 50 MG PO TBEC: DELAYED_RELEASE_TABLET | ORAL | 0 refills | Status: DC

## 2018-10-16 NOTE — Patient Instructions (Signed)
Avoid bending, stooping and avoid lifting weights greater than 10 lbs. Avoid prolong standing and walking. Avoid frequent bending and stooping  No lifting greater than 10 lbs. May use ice or moist heat for pain. Weight loss is of benefit. Handicap license is approved. Dr. Romona Curls secretary/Assistant will call to arrange for epidural steroid injection  Start diclofenac for arthritis pain and nerve swelling. Check a vitamin B12 and folate level due to numbness.

## 2018-10-16 NOTE — Progress Notes (Signed)
Office Visit Note   Patient: Tina [name]           Date of Birth: [birth year]           MRN: [id number] Visit Date: 10/16/2018              Requested by: Carol Ada, [place], [place] [address] PCP: Carol Ada, MD   Assessment & Plan: Visit Diagnoses:  1. Numbness and tingling   2. DDD (degenerative disc disease), lumbar   3. Spinal stenosis of lumbar region with neurogenic claudication     Plan: Avoid bending, stooping and avoid lifting weights greater than 10 lbs. Avoid prolong standing and walking. Avoid frequent bending and stooping  No lifting greater than 10 lbs. May use ice or moist heat for pain. Weight loss is of benefit. Handicap license is approved. Dr. Romona Curls secretary/Assistant will call to arrange for epidural steroid injection  Start diclofenac for arthritis pain and nerve swelling. Check a vitamin B12 and folate level due to numbness.    Follow-Up Instructions: Return in about 3 months (around 01/15/2019).   Orders:  Orders Placed This Encounter  Procedures  . Vitamin B12  . Folate  . Ambulatory referral to Physical Medicine Rehab   Meds ordered this encounter  Medications  . diclofenac (VOLTAREN) 50 MG EC tablet    Sig: Take 1 tablet (50 mg total) by mouth daily after breakfast for 7 days, THEN 1 tablet (50 mg total) 2 (two) times daily after a meal for 21 days.    Dispense:  49 tablet    Refill:  0      Procedures: No procedures performed   Clinical Data: Findings:  MRI of the lumbar spine with mild stenosis subarticular L3-4. Previous left L4-5 laminectomy, no recurrent HNP, DDD L3-4, L4-5 and L5-S1. Patient reports ABIs as normal tested at [facility] in 08/2018. Plain radiographs show DDD L3-4. L4-5 and L5-S1 with retrolisthesis L3-4 and L4-5.     Subjective: Chief Complaint  Patient presents with  . Lower Back - Follow-up    MRI Review    68 year old female with past history of Lumbar  microdiscectomy in 2012. She did well post surgery, previous cervical ACDF by myself prior to the lumbar laminectomy surgery. She is experiencing pain in the left thigh laterally no much below the knee. Up steps pain in the back of the hip and buttocks and with numbness in the thighs right and left, more on the right. No bowel or bladder. Has dfficulty standing In one place, and walking a distance. She is able to bend and stoop. She is not lifting a whole lot but can up to 10 lbs. No  Pain with cough or sneeze. At night there is pain in the left hand. The right hand with tingling into the right arm. Lying down with the right side down with a pillow, numbness into the right radial side and up the arm. Had history of CTS surgery right hand years ago. Stays about the same weight wise. She has seen a therapist at [facility], has not tried NSAIDS or other meds. She has a history of IBS. She leans on the cart with grocery shopping and is able to get grocery shopping done. She could not walk a mile except in pain.  More leg pain than back pain. Feels better sitting.   Review of Systems  Constitutional: Negative for activity change, appetite change, chills, diaphoresis, fatigue,  fever and unexpected weight change.  HENT: Positive for congestion, rhinorrhea and sinus pain. Negative for sinus pressure and sneezing.   Eyes: Positive for itching. Negative for photophobia, pain, discharge and redness.  Respiratory: Negative for apnea, cough, choking, chest tightness, shortness of breath, wheezing and stridor.   Cardiovascular: Negative for chest pain, palpitations and leg swelling.  Gastrointestinal: Negative for abdominal distention, abdominal pain, anal bleeding, blood in stool, constipation, diarrhea, nausea, rectal pain and vomiting.  Endocrine: Negative.   Genitourinary: Negative.   Musculoskeletal: Positive for arthralgias, back pain, gait problem, neck pain and neck stiffness. Negative for joint  swelling and myalgias.  Skin: Negative for color change, pallor, rash and wound.  Neurological: Positive for weakness and numbness. Negative for tremors, seizures, syncope, speech difficulty, light-headedness and headaches.  Hematological: Negative for adenopathy. Does not bruise/bleed easily.  Psychiatric/Behavioral: Negative for agitation, behavioral problems, confusion, decreased concentration, dysphoric mood, hallucinations, self-injury, sleep disturbance and suicidal ideas. The patient is not nervous/anxious and is not hyperactive.      Objective: Vital Signs: BP (!) 148/99   Pulse 85   Ht 5\' 3"  (1.6 m)   Wt 270 lb (122.5 kg)   BMI 47.83 kg/m   Physical Exam Constitutional:      Appearance: She is well-developed.  HENT:     Head: Normocephalic and atraumatic.  Eyes:     Pupils: Pupils are equal, round, and reactive to light.  Neck:     Musculoskeletal: Normal range of motion and neck supple.  Pulmonary:     Effort: Pulmonary effort is normal.     Breath sounds: Normal breath sounds.  Abdominal:     General: Bowel sounds are normal.     Palpations: Abdomen is soft.  Musculoskeletal: Normal range of motion.  Skin:    General: Skin is warm and dry.  Neurological:     Mental Status: She is alert and oriented to person, place, and time.  Psychiatric:        Behavior: Behavior normal.        Thought Content: Thought content normal.        Judgment: Judgment normal.     Ortho Exam  Specialty Comments:  No specialty comments available.  Imaging: No results found.   PMFS History: Patient Active Problem List   Diagnosis Date Noted  . 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 Bronchitis    hx  . Depression     History reviewed. No pertinent family history.  Past Surgical History:  Procedure Laterality Date  . ABDOMINAL HYSTERECTOMY    . BACK SURGERY    . CARPAL  TUNNEL RELEASE     rt  . CERVICAL DISC SURGERY  12  . KNEE ARTHROSCOPY     lft  . LUMBAR LAMINECTOMY  09/30/2011   Procedure: MICRODISCECTOMY LUMBAR LAMINECTOMY;  Surgeon: Jessy Oto, MD;  Location: [facility];  Service: Orthopedics;  Laterality: Right;  Right L4-5 Microdiscectomy using MIS approach   Social History   Occupational History  . Not on file  Tobacco Use  . Smoking status: Current Every Day Smoker    Packs/day: 1.00  . Smokeless tobacco: Never Used  Substance and Sexual Activity  . Alcohol use: No  . Drug use: No  . Sexual activity: Not on file    Comment: hysterectomy

## 2018-10-17 LAB — FOLATE: Folate: >24 ng/mL

## 2018-10-17 LAB — VITAMIN B12: Vitamin B-12: 750 pg/mL (ref 200–1100)

## 2018-10-17 LAB — EXTRA LAV TOP TUBE

## 2018-10-18 ENCOUNTER — Telehealth (INDEPENDENT_AMBULATORY_CARE_PROVIDER_SITE_OTHER): Payer: Self-pay | Admitting: Specialist

## 2018-10-18 NOTE — Telephone Encounter (Signed)
Patient left a message wanting to know the results of her blood work.  CB#[phone].  Thank you.

## 2018-10-19 ENCOUNTER — Telehealth (INDEPENDENT_AMBULATORY_CARE_PROVIDER_SITE_OTHER): Payer: Self-pay | Admitting: Specialist

## 2018-10-19 NOTE — Telephone Encounter (Signed)
I tried to call again---no answer

## 2018-10-19 NOTE — Telephone Encounter (Signed)
Tried to call but no answer,---labs are normal--will try again later.

## 2018-10-19 NOTE — Telephone Encounter (Signed)
Patient called stating needing lab results. Please call on cell# [phone]

## 2018-10-20 NOTE — Telephone Encounter (Signed)
I called and advised that her labs were normal

## 2018-11-01 ENCOUNTER — Encounter (INDEPENDENT_AMBULATORY_CARE_PROVIDER_SITE_OTHER): Payer: Self-pay | Admitting: Physical Medicine and Rehabilitation

## 2018-11-01 ENCOUNTER — Ambulatory Visit (INDEPENDENT_AMBULATORY_CARE_PROVIDER_SITE_OTHER): Payer: Self-pay

## 2018-11-01 ENCOUNTER — Ambulatory Visit (INDEPENDENT_AMBULATORY_CARE_PROVIDER_SITE_OTHER): Payer: 59 | Admitting: Physical Medicine and Rehabilitation

## 2018-11-01 VITALS — BP 149/80 | HR 104 | Temp 97.7°F

## 2018-11-01 DIAGNOSIS — M5416 Radiculopathy, lumbar region: Secondary | ICD-10-CM

## 2018-11-01 DIAGNOSIS — M48062 Spinal stenosis, lumbar region with neurogenic claudication: Secondary | ICD-10-CM | POA: Diagnosis not present

## 2018-11-01 DIAGNOSIS — M961 Postlaminectomy syndrome, not elsewhere classified: Secondary | ICD-10-CM

## 2018-11-01 DIAGNOSIS — F411 Generalized anxiety disorder: Secondary | ICD-10-CM

## 2018-11-01 MED ORDER — METHYLPREDNISOLONE ACETATE 80 MG/ML IJ SUSP
80.0000 mg | Freq: Once | INTRAMUSCULAR | Status: AC
  Administered 2018-11-01: 80 mg

## 2018-11-01 NOTE — Progress Notes (Signed)
 .  Numeric Pain Rating Scale and Functional Assessment Average Pain 9   In the last MONTH (on 0-10 scale) has pain interfered with the following?  1. General activity like being  able to carry out your everyday physical activities such as walking, climbing stairs, carrying groceries, or moving a chair?  Rating(5)   +Driver, -BT, +Dye Allergies(Contrast Media intolerance).

## 2018-11-01 NOTE — Patient Instructions (Signed)

## 2018-11-02 NOTE — Progress Notes (Signed)
Tina [name] - 68 y.o. female MRN [id number]  Date of birth: 19-Aug-1951  Office Visit Note: Visit Date: 11/01/2018 PCP: Carol Ada, MD Referred by: Carol Ada, MD  Subjective: Chief Complaint  Patient presents with  . Lower Back - Pain  . Right Thigh - Pain  . Left Thigh - Pain   HPI: Tina [name] is a 68 y.o. female who comes in today At the request of Dr. Basil Dess for L3-4 interlaminar epidural steroid injection.  Patient has had prior lumbar discectomy at L4-5 with now new onset low back pain and some bilateral thigh pain.  Her case is complicated by significant anxiety depression.  As noted in the procedure note we had a difficult time getting that patient to be able to stay on the exam table in any relaxed manner whatsoever.  Fortunately were able to talk her through the injection we did successfully complete the injection.  In the future would premedicate her with Valium.  ROS Otherwise per HPI.  Assessment & Plan: Visit Diagnoses:  1. Lumbar radiculopathy   2. Spinal stenosis of lumbar region with neurogenic claudication   3. Post laminectomy syndrome   4. Pre-operative anxiety     Plan: No additional findings.   Meds & Orders:  Meds ordered this encounter  Medications  . methylPREDNISolone acetate (DEPO-MEDROL) injection 80 mg    Orders Placed This Encounter  Procedures  . XR C-ARM NO REPORT  . Epidural Steroid injection    Follow-up: Return for Basil Dess, MD.   Procedures: No procedures performed  Lumbar Epidural Steroid Injection - Interlaminar Approach with Fluoroscopic Guidance  Patient: Tina [name]      Date of Birth: [birth year] MRN: [id number] PCP: Carol Ada, MD      Visit Date: 11/01/2018   Universal Protocol:     Consent Given By: the patient  Position: PRONE  Additional Comments: Vital signs were monitored before and after the procedure. Patient was prepped and draped in the usual sterile fashion. The correct patient,  procedure, and site was verified.   Injection Procedure Details:  Procedure Site One Meds Administered:  Meds ordered this encounter  Medications  . methylPREDNISolone acetate (DEPO-MEDROL) injection 80 mg     Laterality: Right  Location/Site:  L3-L4  Needle size: 20 G  Needle type: Tuohy  Needle Placement: Paramedian epidural  Findings:   -Comments: Excellent flow of contrast into the epidural space.  Patient was incredibly anxious and this led to her being uncomfortable laying down and anticipating the injection.  It took [place] a quite amount of energy in time to talk her through the injection but ultimately we did get a successful epidural injection.  In the future would premedicate with Valium.  Procedure Details: Using a paramedian approach from the side mentioned above, the region overlying the inferior lamina was localized under fluoroscopic visualization and the soft tissues overlying this structure were infiltrated with 4 ml. of 1% Lidocaine without Epinephrine. The Tuohy needle was inserted into the epidural space using a paramedian approach.   The epidural space was localized using loss of resistance along with lateral and bi-planar fluoroscopic views.  After negative aspirate for air, blood, and CSF, a 2 ml. volume of Isovue-250 was injected into the epidural space and the flow of contrast was observed. Radiographs were obtained for documentation purposes.    The injectate was administered into the level noted above.   Additional Comments:  No complications occurred Dressing: Band-Aid  Post-procedure details: Patient was observed during the procedure. Post-procedure instructions were reviewed.  Patient left the clinic in stable condition.   Clinical History: MRI LUMBAR SPINE WITHOUT AND WITH CONTRAST  TECHNIQUE: Multiplanar and multiecho pulse sequences of the lumbar spine were obtained without and with intravenous contrast.  CONTRAST:  45mL MULTIHANCE  GADOBENATE DIMEGLUMINE 529 MG/ML IV SOLN  COMPARISON:  Lumbar spine radiographs August 09, 2018 and MRI of the lumbar spine September 16, 2011.  FINDINGS: SEGMENTATION: For the purposes of this report, the last well-formed intervertebral disc is reported as L5-S1.  ALIGNMENT: Maintained lumbar lordosis. Minimal grade 1 L3-4 retrolisthesis is similar.  VERTEBRAE: Vertebral bodies are intact. Stable moderate to severe L5-S1 and moderate L3-4 disc height loss. Similar moderate subacute to chronic discogenic endplate changes I3-4 and L5-S1, mild at L1-2 and L4-5. Mild superimposed acute enhancing discogenic endplate changes V42-59 and L5-S1. No suspicious bone marrow signal. No abnormal bone marrow signal. No abnormal osseous or intradiscal enhancement.  CONUS MEDULLARIS AND CAUDA EQUINA: Conus medullaris terminates at L1 and demonstrates normal morphology and signal characteristics. Cauda equina is normal. No abnormal cord, leptomeningeal or epidural enhancement.  PARASPINAL AND OTHER SOFT TISSUES: Mild paraspinal muscle denervation at and below the level surgical intervention. 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  DISC LEVELS:  T12-L1: No disc bulge, canal stenosis nor neural foraminal narrowing.  L1-2: Annular bulging and mild facet arthropathy without canal stenosis or neural foraminal narrowing.  L2-3: No disc bulge. Mild to moderate facet arthropathy and ligamentum flavum redundancy. Minimal canal stenosis without neural foraminal narrowing.  L3-4: Retrolisthesis. 5 mm broad-based disc bulge similar to prior examination. Mild-to-moderate facet arthropathy and ligamentum flavum redundancy. Mild canal stenosis. Mild bilateral neural foraminal narrowing.  L4-5: Status post old LEFT and new RIGHT laminectomies with enhancing granulation RIGHT surgical bed. No residual or recurrent disc extrusion. Similar 5 mm  broad-based disc bulge, mild facet arthropathy without canal stenosis. Mild-to-moderate neural foraminal narrowing.  L5-S1: Similar 5 mm broad-based disc bulge with small chronic central disc protrusion versus extrusion. Moderate to severe facet arthropathy without canal stenosis. Mild bilateral neural foraminal narrowing.  IMPRESSION: 1. Status post L4-5 bilateral laminectomies without residual or recurrent disc extrusion at this level. 2. Similar degenerative change of the lumbar spine including grade 1 L3-4 retrolisthesis without spondylolysis. 3. Mild canal stenosis L3-4, minimal at L2-3. 4. Neural foraminal narrowing L3-4 through L5-S1: Mild to moderate at L4-5.  Electronically Signed: By: Elon Alas M.D. On: 08/26/2018 13:09   She reports that she has been smoking. She has been smoking about 1.00 pack per day. She has never used smokeless tobacco. No results for input(s): HGBA1C, LABURIC in the last 8760 hours.  Objective:  VS:  HT:    WT:   BMI:     BP:(!) 149/80  HR:(!) 104bpm  TEMP:97.7 F (36.5 C)(Oral)  RESP:  Physical Exam  Ortho Exam Imaging: Xr C-arm No Report  Result Date: 11/01/2018 Please see Notes tab for imaging impression.   Past Medical/Family/Surgical/Social History: Medications & Allergies reviewed per EMR, new medications updated. Patient Active Problem List   Diagnosis Date Noted  . Lumbar disc herniation with radiculopathy 09/30/2011    Class: Acute   Past Medical History:  Diagnosis Date  . Bronchitis    hx  . Depression    History reviewed. No pertinent family history. Past Surgical History:  Procedure Laterality Date  . ABDOMINAL HYSTERECTOMY    . BACK  SURGERY    . CARPAL TUNNEL RELEASE     rt  . CERVICAL DISC SURGERY  12  . KNEE ARTHROSCOPY     lft  . LUMBAR LAMINECTOMY  09/30/2011   Procedure: MICRODISCECTOMY LUMBAR LAMINECTOMY;  Surgeon: Jessy Oto, MD;  Location: [facility];  Service: Orthopedics;  Laterality:  Right;  Right L4-5 Microdiscectomy using MIS approach   Social History   Occupational History  . Not on file  Tobacco Use  . Smoking status: Current Every Day Smoker    Packs/day: 1.00  . Smokeless tobacco: Never Used  Substance and Sexual Activity  . Alcohol use: No  . Drug use: No  . Sexual activity: Not on file    Comment: hysterectomy

## 2018-11-02 NOTE — Procedures (Signed)
Lumbar Epidural Steroid Injection - Interlaminar Approach with Fluoroscopic Guidance  Patient: Tina [name]      Date of Birth: 1950-11-16 MRN: [id number] PCP: Carol Ada, MD      Visit Date: 11/01/2018   Universal Protocol:     Consent Given By: the patient  Position: PRONE  Additional Comments: Vital signs were monitored before and after the procedure. Patient was prepped and draped in the usual sterile fashion. The correct patient, procedure, and site was verified.   Injection Procedure Details:  Procedure Site One Meds Administered:  Meds ordered this encounter  Medications  . methylPREDNISolone acetate (DEPO-MEDROL) injection 80 mg     Laterality: Right  Location/Site:  L3-L4  Needle size: 20 G  Needle type: Tuohy  Needle Placement: Paramedian epidural  Findings:   -Comments: Excellent flow of contrast into the epidural space.  Patient was incredibly anxious and this led to her being uncomfortable laying down and anticipating the injection.  It took [place] a quite amount of energy in time to talk her through the injection but ultimately we did get a successful epidural injection.  In the future would premedicate with Valium.  Procedure Details: Using a paramedian approach from the side mentioned above, the region overlying the inferior lamina was localized under fluoroscopic visualization and the soft tissues overlying this structure were infiltrated with 4 ml. of 1% Lidocaine without Epinephrine. The Tuohy needle was inserted into the epidural space using a paramedian approach.   The epidural space was localized using loss of resistance along with lateral and bi-planar fluoroscopic views.  After negative aspirate for air, blood, and CSF, a 2 ml. volume of Isovue-250 was injected into the epidural space and the flow of contrast was observed. Radiographs were obtained for documentation purposes.    The injectate was administered into the level noted  above.   Additional Comments:  No complications occurred Dressing: Band-Aid    Post-procedure details: Patient was observed during the procedure. Post-procedure instructions were reviewed.  Patient left the clinic in stable condition.

## 2018-11-10 ENCOUNTER — Other Ambulatory Visit (INDEPENDENT_AMBULATORY_CARE_PROVIDER_SITE_OTHER): Payer: Self-pay | Admitting: Specialist

## 2018-11-10 NOTE — Telephone Encounter (Signed)
Diclofenac refill request 

## 2019-01-03 ENCOUNTER — Ambulatory Visit (INDEPENDENT_AMBULATORY_CARE_PROVIDER_SITE_OTHER): Payer: Medicare Other | Admitting: Specialist

## 2019-01-10 ENCOUNTER — Telehealth (INDEPENDENT_AMBULATORY_CARE_PROVIDER_SITE_OTHER): Payer: Self-pay | Admitting: Radiology

## 2019-01-10 NOTE — Telephone Encounter (Signed)
I called and lmom for patient to confirm appt for 01/11/2019 and to ask her the questions for COVID-19.  COVID-19 Questions 1- Do you have now or have you had in the past seven (7) days, had a fever and/or chills?  2- Do you have now or have you had in the past seven (7) days, had a cough?  3- 1 Do you have now or have you had in the past seven (7) days, had nausea, Vomiting, or abdominal pain?  4- Have you been exposed to anyone who has tested positive for COVID-19?    5- Have you, or anyone who lives with you, traveled outside the state within the last month?

## 2019-01-11 ENCOUNTER — Other Ambulatory Visit: Payer: Self-pay

## 2019-01-11 ENCOUNTER — Ambulatory Visit (INDEPENDENT_AMBULATORY_CARE_PROVIDER_SITE_OTHER): Payer: 59 | Admitting: Specialist

## 2019-01-11 ENCOUNTER — Encounter (INDEPENDENT_AMBULATORY_CARE_PROVIDER_SITE_OTHER): Payer: Self-pay | Admitting: Specialist

## 2019-01-11 VITALS — BP 126/80 | HR 103 | Ht 63.0 in | Wt 270.0 lb

## 2019-01-11 DIAGNOSIS — M48062 Spinal stenosis, lumbar region with neurogenic claudication: Secondary | ICD-10-CM

## 2019-01-11 DIAGNOSIS — M5136 Other intervertebral disc degeneration, lumbar region: Secondary | ICD-10-CM

## 2019-01-11 DIAGNOSIS — M51369 Other intervertebral disc degeneration, lumbar region without mention of lumbar back pain or lower extremity pain: Secondary | ICD-10-CM

## 2019-01-11 NOTE — Progress Notes (Signed)
Office Visit Note   Patient: Tina [name]           Date of Birth: 03/10/1951           MRN: [id number] Visit Date: 01/11/2019              Requested by: Carol Ada, [place], [place] [address] PCP: Carol Ada, MD   Assessment & Plan: Visit Diagnoses:  1. Spinal stenosis of lumbar region with neurogenic claudication   2. Degenerative disc disease, lumbar     Plan: Avoid bending, stooping and avoid lifting weights greater than 10 lbs. Avoid prolong standing and walking. Avoid frequent bending and stooping  No lifting greater than 10 lbs. May use ice or moist heat for pain. Weight loss is of benefit. Handicap license is approved. Nutritional specialist for a weight reduction program as BMI is 47 and needs t be 40 before any surgical solution can be considered.  Follow-Up Instructions: Return in about 3 months (around 04/12/2019).   Orders:  No orders of the defined types were placed in this encounter.  No orders of the defined types were placed in this encounter.     Procedures: No procedures performed   Clinical Data: No additional findings.   Subjective: Chief Complaint  Patient presents with  . Lower Back - Follow-up    68 year old female with pain in the legs with standing and walking. She has difficulty walking but could walk one mile, but in pain and she would stop and have to sit down to get improvement in the pain. There is numbness in the buttocks and in the lateral thighs and there is a difficulty in bringing the legs forward. No bowel or bladder concerns. No pain with coughing or sneezing. Had injection by Dr. Ernestina Patches 1/29 and she reports it was of no benefit. Maybe one or two moments but she reports no more and she states she probably wasn't one of his best patients. 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 Not taking diclofenac due to concerns about side effects: stroke and heart  attack. No history of stroke or MI. Needs f/u U?S of aorta in 3 years. Previous bilateral microdiscectomy L4-5 09/2011. MRI done 08/2018 with mild stenosis, L2-3 and L3-4, retrolisthesis L3-4 and DDD.     Review of Systems  Constitutional: Positive for unexpected weight change. Negative for activity change, appetite change, chills, diaphoresis, fatigue and fever.  HENT: Positive for congestion. Negative for dental problem, drooling, ear discharge, ear pain, facial swelling, hearing loss, mouth sores, nosebleeds, postnasal drip, rhinorrhea, sinus pressure, sinus pain, sneezing, sore throat, tinnitus, trouble swallowing and voice change.   Eyes: Positive for redness, itching and visual disturbance. Negative for photophobia, pain and discharge.  Respiratory: Negative for apnea, cough, choking, chest tightness, shortness of breath, wheezing and stridor.   Cardiovascular: Negative for chest pain, palpitations and leg swelling.  Gastrointestinal: Negative for abdominal distention, abdominal pain, anal bleeding, blood in stool, constipation, diarrhea, nausea, rectal pain and vomiting.  Endocrine: Negative for cold intolerance, heat intolerance, polydipsia, polyphagia and polyuria.  Genitourinary: Negative for difficulty urinating, dyspareunia, dysuria, enuresis, flank pain, frequency, genital sores, hematuria, menstrual problem, pelvic pain and urgency.  Musculoskeletal: Positive for back pain and gait problem. Negative for arthralgias, joint swelling, myalgias, neck pain and neck stiffness.  Skin: Negative for color change, pallor, rash and wound.  Allergic/Immunologic: Positive for environmental allergies.  Neurological: Positive  for weakness and numbness. Negative for dizziness, tremors, seizures, syncope, facial asymmetry, speech difficulty, light-headedness and headaches.  Hematological: Negative for adenopathy. Does not bruise/bleed easily.  Psychiatric/Behavioral: Positive for dysphoric mood.  Negative for agitation, behavioral problems, confusion, decreased concentration, hallucinations, self-injury, sleep disturbance and suicidal ideas. The patient is nervous/anxious. The patient is not hyperactive.      Objective: Vital Signs: BP 126/80 (BP Location: Left Arm, Patient Position: Sitting)   Pulse (!) 103   Ht 5\' 3"  (1.6 m)   Wt 270 lb (122.5 kg)   BMI 47.83 kg/m   Physical Exam Constitutional:      Appearance: She is well-developed.  HENT:     Head: Normocephalic and atraumatic.  Eyes:     Pupils: Pupils are equal, round, and reactive to light.  Neck:     Musculoskeletal: Normal range of motion and neck supple.  Pulmonary:     Effort: Pulmonary effort is normal.     Breath sounds: Normal breath sounds.  Abdominal:     General: Bowel sounds are normal.     Palpations: Abdomen is soft.  Skin:    General: Skin is warm and dry.  Neurological:     Mental Status: She is alert and oriented to person, place, and time.  Psychiatric:        Behavior: Behavior normal.        Thought Content: Thought content normal.        Judgment: Judgment normal.     Back Exam   Tenderness  The patient is experiencing tenderness in the lumbar.  Range of Motion  Extension: 20  Flexion: 80  Lateral bend right: abnormal  Lateral bend left: abnormal  Rotation right: abnormal  Rotation left: abnormal   Muscle Strength  Right Quadriceps:  5/5  Left Quadriceps:  5/5  Right Hamstrings:  5/5  Left Hamstrings:  5/5   Tests  Straight leg raise right: negative Straight leg raise left: negative  Reflexes  Patellar: 2/4 Achilles: 2/4 Babinski's sign: normal   Other  Heel walk: normal Sensation: normal Gait: normal  Erythema: no back redness Scars: absent  Comments:  No focal motor deficit, no clonus or spasticity      Specialty Comments:  No specialty comments available.  Imaging: No results found.   PMFS History: Patient Active Problem List   Diagnosis  Date Noted  . 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 Bronchitis    hx  . Depression     History reviewed. No pertinent family history.  Past Surgical History:  Procedure Laterality Date  . ABDOMINAL HYSTERECTOMY    . BACK SURGERY    . CARPAL TUNNEL RELEASE     rt  . CERVICAL DISC SURGERY  12  . KNEE ARTHROSCOPY     lft  . LUMBAR LAMINECTOMY  09/30/2011   Procedure: MICRODISCECTOMY LUMBAR LAMINECTOMY;  Surgeon: Jessy Oto, MD;  Location: [facility];  Service: Orthopedics;  Laterality: Right;  Right L4-5 Microdiscectomy using MIS approach   Social History   Occupational History  . Not on file  Tobacco Use  . Smoking status: Current Every Day Smoker    Packs/day: 1.00  . Smokeless tobacco: Never Used  Substance and Sexual Activity  . Alcohol use: No  . Drug use: No  . Sexual activity: Not on file    Comment: hysterectomy

## 2019-01-11 NOTE — Patient Instructions (Addendum)
Avoid bending, stooping and avoid lifting weights greater than 10 lbs. Avoid prolong standing and walking. Avoid frequent bending and stooping  No lifting greater than 10 lbs. May use ice or moist heat for pain. Weight loss is of benefit. Handicap license is approved. Nutritional specialist for a weight reduction program as BMI is 47 and needs t be 40 before any surgical solution can be considered.

## 2019-01-17 ENCOUNTER — Ambulatory Visit (INDEPENDENT_AMBULATORY_CARE_PROVIDER_SITE_OTHER): Payer: 59 | Admitting: Specialist

## 2019-04-12 ENCOUNTER — Ambulatory Visit: Payer: Self-pay | Admitting: Specialist

## 2019-07-18 ENCOUNTER — Telehealth: Payer: Self-pay | Admitting: Specialist

## 2019-07-18 NOTE — Telephone Encounter (Signed)
Patient called. She would like her handicap sticker renewed. Her call back number is [phone]

## 2019-07-20 NOTE — Telephone Encounter (Signed)
Gave to Dr. Louanne Skye to sign

## 2019-07-20 NOTE — Telephone Encounter (Signed)
I called and advised her the form was ready she requested that it be mailed to her.

## 2020-02-14 ENCOUNTER — Other Ambulatory Visit: Payer: Self-pay

## 2020-02-14 ENCOUNTER — Ambulatory Visit: Payer: Self-pay

## 2020-02-14 ENCOUNTER — Ambulatory Visit: Payer: Medicare HMO | Admitting: Physician Assistant

## 2020-02-14 ENCOUNTER — Encounter: Payer: Self-pay | Admitting: Orthopedic Surgery

## 2020-02-14 VITALS — Ht 63.0 in | Wt 270.0 lb

## 2020-02-14 DIAGNOSIS — M25561 Pain in right knee: Secondary | ICD-10-CM | POA: Diagnosis not present

## 2020-02-14 MED ORDER — LIDOCAINE HCL 1 % IJ SOLN
1.0000 mL | INTRAMUSCULAR | Status: AC | PRN
  Administered 2020-02-14: 1 mL

## 2020-02-14 MED ORDER — METHYLPREDNISOLONE ACETATE 40 MG/ML IJ SUSP
40.0000 mg | INTRAMUSCULAR | Status: AC | PRN
  Administered 2020-02-14: 40 mg via INTRA_ARTICULAR

## 2020-02-14 NOTE — Progress Notes (Signed)
Office Visit Note   Patient: Tina [name]           Date of Birth: 11-Jul-1951           MRN: [id number] Visit Date: 02/14/2020              Requested by: Carol Ada, [place],  [place] [address] PCP: Carol Ada, MD  Chief Complaint  Patient presents with  . Right Knee - Pain      HPI: This is a pleasant 69 year old woman who is 1 week status post onset of pain to her right knee.  She said she was on her knees planting flowers.  She felt something pull in her right knee.  And then she had difficulty getting up.  She now has pain globally around the knee that goes down the back of her calf.  She also has difficulty fully extending her knee secondary to pain in the knee and fully flexing her knee as well.  Assessment & Plan: Visit Diagnoses:  1. Acute pain of right knee     Plan: Findings most consistent with probably a medial meniscus tear with associated Baker's cyst rupture.  I recommend she continuing to treat this symptomatically and she is slowly getting better.  I would also recommend an injection into the knee as she does have a lot of pain especially over the medial joint line with extension and flexion she would like to go forward with this today she will follow-up in 3 weeks  Follow-Up Instructions: No follow-ups on file.   Ortho Exam  Patient is alert, oriented, no adenopathy, well-dressed, normal affect, normal respiratory effort. Right knee: No effusion.  No cellulitis.  Compartments of the lower leg are soft and compressible she is acutely tender over the medial joint line with some radiation down the back of her knee and into the upper part of her calf.  No tenderness in the mid and lower calf.  Imaging: No results found. No images are attached to the encounter.  Labs: No results found for: HGBA1C, ESRSEDRATE, CRP, LABURIC, REPTSTATUS, GRAMSTAIN, CULT, LABORGA   Lab Results  Component Value Date   ALBUMIN 3.6 06/23/2011     No results found for: MG No results found for: VD25OH  No results found for: PREALBUMIN CBC EXTENDED Latest Ref Rng & Units 10/01/2011 09/24/2011 06/23/2011  WBC 4.0 - 10.5 K/uL 13.1(H) 10.7(H) 10.4  RBC 3.87 - 5.11 MIL/uL 3.63(L) 4.62 4.68  HGB 12.0 - 15.0 g/dL 11.3(L) 15.1(H) 15.0  HCT 36.0 - 46.0 % 33.5(L) 42.5 42.9  PLT 150 - 400 K/uL 233 274 285  NEUTROABS 1.7 - 7.7 K/uL - - 5.2  LYMPHSABS 0.7 - 4.0 K/uL - - 3.9     Body mass index is 47.83 kg/m.  Orders:  Orders Placed This Encounter  Procedures  . XR Knee 1-2 Views Right   No orders of the defined types were placed in this encounter.    Procedures: Large Joint Inj on 02/14/2020 9:32 AM Indications: pain and diagnostic evaluation Details: 22 G 1.5 in needle, anterolateral approach  Arthrogram: No  Medications: 40 mg methylPREDNISolone acetate 40 MG/ML; 1 mL lidocaine 1 % Outcome: tolerated well, no immediate complications Procedure, treatment alternatives, risks and benefits explained, specific risks discussed. Consent was given by the patient.      Clinical Data: No additional findings.  ROS:  All other systems negative, except as noted in the HPI. Review of Systems  Objective: Vital Signs: Ht 5\' 3"  (1.6 m)   Wt 270 lb (122.5 kg)   BMI 47.83 kg/m   Specialty Comments:  No specialty comments available.  PMFS History: Patient Active Problem List   Diagnosis Date Noted  . Lumbar disc herniation with radiculopathy 09/30/2011    Class: Acute   Past Medical History:  Diagnosis Date  . Bronchitis    hx  . Depression     No family history on file.  Past Surgical History:  Procedure Laterality Date  . ABDOMINAL HYSTERECTOMY    . BACK SURGERY    . CARPAL TUNNEL RELEASE     rt  . CERVICAL DISC SURGERY  12  . KNEE ARTHROSCOPY     lft  . LUMBAR LAMINECTOMY  09/30/2011   Procedure: MICRODISCECTOMY LUMBAR LAMINECTOMY;  Surgeon: Jessy Oto, MD;  Location: [facility];  Service: Orthopedics;   Laterality: Right;  Right L4-5 Microdiscectomy using MIS approach   Social History   Occupational History  . Not on file  Tobacco Use  . Smoking status: Current Every Day Smoker    Packs/day: 1.00  . Smokeless tobacco: Never Used  Substance and Sexual Activity  . Alcohol use: No  . Drug use: No  . Sexual activity: Not on file    Comment: hysterectomy

## 2020-03-06 ENCOUNTER — Encounter: Payer: Self-pay | Admitting: Orthopedic Surgery

## 2020-03-06 ENCOUNTER — Ambulatory Visit: Payer: Medicare HMO | Admitting: Orthopedic Surgery

## 2020-03-06 ENCOUNTER — Other Ambulatory Visit: Payer: Self-pay

## 2020-03-06 VITALS — Ht 63.0 in | Wt 270.0 lb

## 2020-03-06 DIAGNOSIS — S83241A Other tear of medial meniscus, current injury, right knee, initial encounter: Secondary | ICD-10-CM | POA: Diagnosis not present

## 2020-03-13 ENCOUNTER — Encounter: Payer: Self-pay | Admitting: Orthopedic Surgery

## 2020-03-13 NOTE — Progress Notes (Signed)
Office Visit Note   Patient: Tina [name]           Date of Birth: 1951-09-17           MRN: [id number] Visit Date: 03/06/2020              Requested by: Carol Ada, [place],  [place] [address] PCP: Carol Ada, MD  Chief Complaint  Patient presents with  . Right Knee - Follow-up    S/p injection 02/14/20 right knee       HPI: Patient is a 69 year old woman who presents in follow-up status post steroid injection right knee.  Patient states she is still doing poorly she is using a cane for ambulation she states the injection was not very helpful she complains of medial sided joint pain with swelling and pain radiating into her calf.  She states she has decreased range of motion of her knee.  Assessment & Plan: Visit Diagnoses:  1. Acute medial meniscus tear of right knee, initial encounter     Plan: We will obtain an MRI scan of her right knee to evaluate the medial meniscus.  Follow-up after the MRI scan is obtained  Follow-Up Instructions: Return in about 2 weeks (around 03/20/2020) for Follow-up after the MRI scan.   Ortho Exam  Patient is alert, oriented, no adenopathy, well-dressed, normal affect, normal respiratory effort. Examination patient does have venous stasis insufficiency with varicose veins no open ulcers there is an effusion of the left knee collaterals and cruciates are stable.  Imaging: No results found. No images are attached to the encounter.  Labs: No results found for: HGBA1C, ESRSEDRATE, CRP, LABURIC, REPTSTATUS, GRAMSTAIN, CULT, LABORGA   Lab Results  Component Value Date   ALBUMIN 3.6 06/23/2011    No results found for: MG No results found for: VD25OH  No results found for: PREALBUMIN CBC EXTENDED Latest Ref Rng & Units 10/01/2011 09/24/2011 06/23/2011  WBC 4.0 - 10.5 K/uL 13.1(H) 10.7(H) 10.4  RBC 3.87 - 5.11 MIL/uL 3.63(L) 4.62 4.68  HGB 12.0 - 15.0 g/dL 11.3(L) 15.1(H) 15.0  HCT 36 - 46 % 33.5(L)  42.5 42.9  PLT 150 - 400 K/uL 233 274 285  NEUTROABS 1.7 - 7.7 K/uL - - 5.2  LYMPHSABS 0.7 - 4.0 K/uL - - 3.9     Body mass index is 47.83 kg/m.  Orders:  Orders Placed This Encounter  Procedures  . MR Knee Right w/o contrast   No orders of the defined types were placed in this encounter.    Procedures: No procedures performed  Clinical Data: No additional findings.  ROS:  All other systems negative, except as noted in the HPI. Review of Systems  Objective: Vital Signs: Ht 5\' 3"  (1.6 m)   Wt 270 lb (122.5 kg)   BMI 47.83 kg/m   Specialty Comments:  No specialty comments available.  PMFS History: Patient Active Problem List   Diagnosis Date Noted  . Lumbar disc herniation with radiculopathy 09/30/2011    Class: Acute   Past Medical History:  Diagnosis Date  . Bronchitis    hx  . Depression     History reviewed. No pertinent family history.  Past Surgical History:  Procedure Laterality Date  . ABDOMINAL HYSTERECTOMY    . BACK SURGERY    . CARPAL TUNNEL RELEASE     rt  . CERVICAL DISC SURGERY  12  . KNEE ARTHROSCOPY     lft  . LUMBAR  LAMINECTOMY  09/30/2011   Procedure: MICRODISCECTOMY LUMBAR LAMINECTOMY;  Surgeon: Jessy Oto, MD;  Location: [facility];  Service: Orthopedics;  Laterality: Right;  Right L4-5 Microdiscectomy using MIS approach   Social History   Occupational History  . Not on file  Tobacco Use  . Smoking status: Current Every Day Smoker    Packs/day: 1.00  . Smokeless tobacco: Never Used  Substance and Sexual Activity  . Alcohol use: No  . Drug use: No  . Sexual activity: Not on file    Comment: hysterectomy

## 2020-04-05 ENCOUNTER — Ambulatory Visit
Admission: RE | Admit: 2020-04-05 | Discharge: 2020-04-05 | Disposition: A | Discharge status: Home / Self Care | Payer: Medicare HMO | Source: Ambulatory Visit | Attending: Orthopedic Surgery | Admitting: Orthopedic Surgery

## 2020-04-05 ENCOUNTER — Other Ambulatory Visit: Payer: Self-pay

## 2020-04-05 DIAGNOSIS — S83241A Other tear of medial meniscus, current injury, right knee, initial encounter: Secondary | ICD-10-CM

## 2020-04-05 DIAGNOSIS — M25561 Pain in right knee: Secondary | ICD-10-CM | POA: Diagnosis not present

## 2020-04-08 ENCOUNTER — Ambulatory Visit: Payer: Medicare HMO | Admitting: Orthopedic Surgery

## 2020-04-08 ENCOUNTER — Encounter: Payer: Self-pay | Admitting: Orthopedic Surgery

## 2020-04-08 ENCOUNTER — Other Ambulatory Visit: Payer: Self-pay

## 2020-04-08 VITALS — Ht 63.0 in | Wt 270.0 lb

## 2020-04-08 DIAGNOSIS — S83241A Other tear of medial meniscus, current injury, right knee, initial encounter: Secondary | ICD-10-CM

## 2020-04-08 DIAGNOSIS — I872 Venous insufficiency (chronic) (peripheral): Secondary | ICD-10-CM

## 2020-04-08 NOTE — Progress Notes (Signed)
Office Visit Note   Patient: Tina [name]           Date of Birth: Jul 10, 1951           MRN: [id number] Visit Date: 04/08/2020              Requested by: Carol Ada, [place],  [place] [address] PCP: Carol Ada, MD  Chief Complaint  Patient presents with  . Right Knee - Follow-up    MRI review       HPI: Patient is a 69 year old woman with persistent medial joint line pain right knee patient complains of popping catching and giving way as well as swelling.  Patient also complains of varicose veins.  Patient has no history of sleep apnea does not use a CPAP machine.  Assessment & Plan: Visit Diagnoses:  1. Acute medial meniscus tear of right knee, initial encounter     Plan: Recommended knee-high compression stockings for the varicose veins discussed that arthroscopy is an option for debridement of medial meniscal tear.  Discussed that we should be able to resolve the meniscal symptoms but she may still have arthritic symptoms.  Risks and benefits of surgery were discussed including the risk for potential total knee arthroplasty.  Patient states she understands wishes to proceed with arthroscopic surgery at this time.  Follow-Up Instructions: Return in about 2 weeks (around 04/22/2020).   Ortho Exam  Patient is alert, oriented, no adenopathy, well-dressed, normal affect, normal respiratory effort. Examination patient does have varicose veins with venous swelling in both lower extremities but no open ulcers.  There is an effusion to the right knee she is maximally tender to palpation of the medial joint line collaterals are cruciates are stable.  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  Imaging: No results found. No images are attached to the encounter.  Labs: No results found for: HGBA1C, ESRSEDRATE,  CRP, LABURIC, REPTSTATUS, GRAMSTAIN, CULT, LABORGA   Lab Results  Component Value Date   ALBUMIN 3.6 06/23/2011    No results found for: MG No results found for: VD25OH  No results found for: PREALBUMIN CBC EXTENDED Latest Ref Rng & Units 10/01/2011 09/24/2011 06/23/2011  WBC 4.0 - 10.5 K/uL 13.1(H) 10.7(H) 10.4  RBC 3.87 - 5.11 MIL/uL 3.63(L) 4.62 4.68  HGB 12.0 - 15.0 g/dL 11.3(L) 15.1(H) 15.0  HCT 36 - 46 % 33.5(L) 42.5 42.9  PLT 150 - 400 K/uL 233 274 285  NEUTROABS 1.7 - 7.7 K/uL - - 5.2  LYMPHSABS 0.7 - 4.0 K/uL - - 3.9     Body mass index is 47.83 kg/m.  Orders:  No orders of the defined types were placed in this encounter.  No orders of the defined types were placed in this encounter.    Procedures: No procedures performed  Clinical Data: No additional findings.  ROS:  All other systems negative, except as noted in the HPI. Review of Systems  Objective: Vital Signs: Ht 5\' 3"  (1.6 m)   Wt 270 lb (122.5 kg)   BMI 47.83 kg/m   Specialty Comments:  No specialty comments available.  PMFS History: Patient Active Problem List   Diagnosis Date Noted  . Lumbar disc herniation with radiculopathy 09/30/2011    Class: Acute   Past Medical History:  Diagnosis Date  . Bronchitis    hx  .  Depression     History reviewed. No pertinent family history.  Past Surgical History:  Procedure Laterality Date  . ABDOMINAL HYSTERECTOMY    . BACK SURGERY    . CARPAL TUNNEL RELEASE     rt  . CERVICAL DISC SURGERY  12  . KNEE ARTHROSCOPY     lft  . LUMBAR LAMINECTOMY  09/30/2011   Procedure: MICRODISCECTOMY LUMBAR LAMINECTOMY;  Surgeon: Jessy Oto, MD;  Location: [facility];  Service: Orthopedics;  Laterality: Right;  Right L4-5 Microdiscectomy using MIS approach   Social History   Occupational History  . Not on file  Tobacco Use  . Smoking status: Current Every Day Smoker    Packs/day: 1.00  . Smokeless tobacco: Never Used  Substance and Sexual Activity    . Alcohol use: No  . Drug use: No  . Sexual activity: Not on file    Comment: hysterectomy

## 2020-04-16 ENCOUNTER — Other Ambulatory Visit: Payer: Self-pay

## 2020-04-23 ENCOUNTER — Other Ambulatory Visit: Payer: Self-pay | Admitting: Physician Assistant

## 2020-04-29 ENCOUNTER — Encounter (HOSPITAL_BASED_OUTPATIENT_CLINIC_OR_DEPARTMENT_OTHER): Payer: Self-pay | Admitting: Orthopedic Surgery

## 2020-04-29 ENCOUNTER — Other Ambulatory Visit: Payer: Self-pay

## 2020-05-02 ENCOUNTER — Other Ambulatory Visit (HOSPITAL_COMMUNITY)
Admission: RE | Admit: 2020-05-02 | Discharge: 2020-05-02 | Disposition: A | Discharge status: Home / Self Care | Payer: Medicare HMO | Source: Ambulatory Visit | Attending: Orthopedic Surgery | Admitting: Orthopedic Surgery

## 2020-05-02 DIAGNOSIS — Z01812 Encounter for preprocedural laboratory examination: Secondary | ICD-10-CM | POA: Insufficient documentation

## 2020-05-02 DIAGNOSIS — Z20822 Contact with and (suspected) exposure to covid-19: Secondary | ICD-10-CM | POA: Diagnosis not present

## 2020-05-02 LAB — SARS CORONAVIRUS 2 (TAT 6-24 HRS): SARS Coronavirus 2: NEGATIVE

## 2020-05-02 NOTE — Progress Notes (Signed)

## 2020-05-02 NOTE — Progress Notes (Signed)
BMI reviewed by [facility] and will proceed with surgery as scheduled at [facility]

## 2020-05-06 ENCOUNTER — Ambulatory Visit (HOSPITAL_BASED_OUTPATIENT_CLINIC_OR_DEPARTMENT_OTHER): Admission: RE | Admit: 2020-05-06 | Payer: Medicare HMO | Source: Home / Self Care | Admitting: Orthopedic Surgery

## 2020-05-06 HISTORY — DX: Chronic obstructive pulmonary disease, unspecified: J44.9

## 2020-05-06 HISTORY — DX: Hyperlipidemia, unspecified: E78.5

## 2020-05-06 HISTORY — DX: Nicotine dependence, unspecified, uncomplicated: F17.200

## 2020-05-06 HISTORY — DX: Anxiety disorder, unspecified: F41.9

## 2020-05-06 SURGERY — ARTHROSCOPY, KNEE
Anesthesia: Choice | Site: Knee | Laterality: Right

## 2020-05-30 ENCOUNTER — Other Ambulatory Visit: Payer: Self-pay

## 2020-06-02 ENCOUNTER — Other Ambulatory Visit: Payer: Self-pay

## 2020-06-02 ENCOUNTER — Encounter (HOSPITAL_BASED_OUTPATIENT_CLINIC_OR_DEPARTMENT_OTHER): Payer: Self-pay | Admitting: Orthopedic Surgery

## 2020-06-03 ENCOUNTER — Other Ambulatory Visit: Payer: Self-pay | Admitting: Physician Assistant

## 2020-06-06 ENCOUNTER — Other Ambulatory Visit (HOSPITAL_COMMUNITY)
Admission: RE | Admit: 2020-06-06 | Discharge: 2020-06-06 | Disposition: A | Discharge status: Home / Self Care | Payer: Medicare HMO | Source: Ambulatory Visit | Attending: Orthopedic Surgery | Admitting: Orthopedic Surgery

## 2020-06-06 DIAGNOSIS — Z20822 Contact with and (suspected) exposure to covid-19: Secondary | ICD-10-CM | POA: Insufficient documentation

## 2020-06-06 DIAGNOSIS — Z01812 Encounter for preprocedural laboratory examination: Secondary | ICD-10-CM | POA: Insufficient documentation

## 2020-06-06 LAB — SARS CORONAVIRUS 2 (TAT 6-24 HRS): SARS Coronavirus 2: NEGATIVE

## 2020-06-10 ENCOUNTER — Ambulatory Visit (HOSPITAL_BASED_OUTPATIENT_CLINIC_OR_DEPARTMENT_OTHER): Payer: Medicare HMO | Admitting: Certified Registered"

## 2020-06-10 ENCOUNTER — Ambulatory Visit (HOSPITAL_BASED_OUTPATIENT_CLINIC_OR_DEPARTMENT_OTHER)
Admission: RE | Admit: 2020-06-10 | Discharge: 2020-06-10 | Disposition: A | Discharge status: Home / Self Care | Payer: Medicare HMO | Attending: Orthopedic Surgery | Admitting: Orthopedic Surgery

## 2020-06-10 ENCOUNTER — Other Ambulatory Visit: Payer: Self-pay

## 2020-06-10 ENCOUNTER — Encounter (HOSPITAL_BASED_OUTPATIENT_CLINIC_OR_DEPARTMENT_OTHER): Payer: Self-pay | Admitting: Orthopedic Surgery

## 2020-06-10 ENCOUNTER — Encounter (HOSPITAL_BASED_OUTPATIENT_CLINIC_OR_DEPARTMENT_OTHER)
Admission: RE | Disposition: A | Discharge status: Home / Self Care | Payer: Self-pay | Source: Home / Self Care | Attending: Orthopedic Surgery

## 2020-06-10 DIAGNOSIS — M21861 Other specified acquired deformities of right lower leg: Secondary | ICD-10-CM | POA: Insufficient documentation

## 2020-06-10 DIAGNOSIS — F329 Major depressive disorder, single episode, unspecified: Secondary | ICD-10-CM | POA: Insufficient documentation

## 2020-06-10 DIAGNOSIS — M6751 Plica syndrome, right knee: Secondary | ICD-10-CM | POA: Diagnosis not present

## 2020-06-10 DIAGNOSIS — F17200 Nicotine dependence, unspecified, uncomplicated: Secondary | ICD-10-CM | POA: Insufficient documentation

## 2020-06-10 DIAGNOSIS — E785 Hyperlipidemia, unspecified: Secondary | ICD-10-CM | POA: Insufficient documentation

## 2020-06-10 DIAGNOSIS — Z888 Allergy status to other drugs, medicaments and biological substances status: Secondary | ICD-10-CM | POA: Insufficient documentation

## 2020-06-10 DIAGNOSIS — F419 Anxiety disorder, unspecified: Secondary | ICD-10-CM | POA: Diagnosis not present

## 2020-06-10 DIAGNOSIS — Z90710 Acquired absence of both cervix and uterus: Secondary | ICD-10-CM | POA: Diagnosis not present

## 2020-06-10 DIAGNOSIS — Z91041 Radiographic dye allergy status: Secondary | ICD-10-CM | POA: Insufficient documentation

## 2020-06-10 DIAGNOSIS — M23203 Derangement of unspecified medial meniscus due to old tear or injury, right knee: Secondary | ICD-10-CM | POA: Diagnosis not present

## 2020-06-10 DIAGNOSIS — J449 Chronic obstructive pulmonary disease, unspecified: Secondary | ICD-10-CM | POA: Diagnosis not present

## 2020-06-10 DIAGNOSIS — Z79899 Other long term (current) drug therapy: Secondary | ICD-10-CM | POA: Insufficient documentation

## 2020-06-10 DIAGNOSIS — S83241A Other tear of medial meniscus, current injury, right knee, initial encounter: Secondary | ICD-10-CM | POA: Diagnosis not present

## 2020-06-10 DIAGNOSIS — F418 Other specified anxiety disorders: Secondary | ICD-10-CM | POA: Diagnosis not present

## 2020-06-10 DIAGNOSIS — M23221 Derangement of posterior horn of medial meniscus due to old tear or injury, right knee: Secondary | ICD-10-CM

## 2020-06-10 DIAGNOSIS — M21961 Unspecified acquired deformity of right lower leg: Secondary | ICD-10-CM | POA: Diagnosis present

## 2020-06-10 DIAGNOSIS — Z885 Allergy status to narcotic agent status: Secondary | ICD-10-CM | POA: Insufficient documentation

## 2020-06-10 DIAGNOSIS — M659 Synovitis and tenosynovitis, unspecified: Secondary | ICD-10-CM | POA: Insufficient documentation

## 2020-06-10 DIAGNOSIS — Z91048 Other nonmedicinal substance allergy status: Secondary | ICD-10-CM | POA: Diagnosis not present

## 2020-06-10 HISTORY — PX: KNEE ARTHROSCOPY: SHX127

## 2020-06-10 SURGERY — ARTHROSCOPY, KNEE
Anesthesia: General | Site: Knee | Laterality: Right

## 2020-06-10 MED ORDER — FENTANYL CITRATE (PF) 100 MCG/2ML IJ SOLN
INTRAMUSCULAR | Status: AC
  Filled 2020-06-10: qty 2

## 2020-06-10 MED ORDER — SODIUM CHLORIDE 0.9 % IR SOLN
Status: DC | PRN
  Administered 2020-06-10 (×4): 3000 mL

## 2020-06-10 MED ORDER — FENTANYL CITRATE (PF) 100 MCG/2ML IJ SOLN: 25.0000 ug | INTRAMUSCULAR | Status: DC | PRN

## 2020-06-10 MED ORDER — LIDOCAINE 2% (20 MG/ML) 5 ML SYRINGE
INTRAMUSCULAR | Status: DC | PRN
  Administered 2020-06-10: 60 mg via INTRAVENOUS

## 2020-06-10 MED ORDER — PROPOFOL 10 MG/ML IV BOLUS
INTRAVENOUS | Status: DC | PRN
  Administered 2020-06-10: 200 mg via INTRAVENOUS

## 2020-06-10 MED ORDER — BUPIVACAINE HCL (PF) 0.5 % IJ SOLN
INTRAMUSCULAR | Status: AC
  Filled 2020-06-10: qty 60

## 2020-06-10 MED ORDER — TRAMADOL HCL 50 MG PO TABS
ORAL_TABLET | ORAL | Status: AC
  Filled 2020-06-10: qty 1

## 2020-06-10 MED ORDER — HYDROCODONE-ACETAMINOPHEN 5-325 MG PO TABS: 1.0000 | ORAL_TABLET | ORAL | 0 refills | Status: DC | PRN

## 2020-06-10 MED ORDER — BUPIVACAINE HCL (PF) 0.25 % IJ SOLN
INTRAMUSCULAR | Status: AC
  Filled 2020-06-10: qty 90

## 2020-06-10 MED ORDER — DEXTROSE 5 % IV SOLN
3.0000 g | INTRAVENOUS | Status: AC
  Administered 2020-06-10: 2 g via INTRAVENOUS
  Filled 2020-06-10 (×2): qty 3000

## 2020-06-10 MED ORDER — CEFAZOLIN SODIUM-DEXTROSE 2-4 GM/100ML-% IV SOLN: 2.0000 g | INTRAVENOUS | Status: DC

## 2020-06-10 MED ORDER — FENTANYL CITRATE (PF) 100 MCG/2ML IJ SOLN
25.0000 ug | INTRAMUSCULAR | Status: DC | PRN
  Administered 2020-06-10 (×3): 50 ug via INTRAVENOUS

## 2020-06-10 MED ORDER — FENTANYL CITRATE (PF) 100 MCG/2ML IJ SOLN
INTRAMUSCULAR | Status: DC | PRN
  Administered 2020-06-10 (×2): 25 ug via INTRAVENOUS
  Administered 2020-06-10: 50 ug via INTRAVENOUS

## 2020-06-10 MED ORDER — ACETAMINOPHEN 500 MG PO TABS
ORAL_TABLET | ORAL | Status: AC
  Filled 2020-06-10: qty 2

## 2020-06-10 MED ORDER — PROPOFOL 10 MG/ML IV BOLUS
INTRAVENOUS | Status: AC
  Filled 2020-06-10: qty 40

## 2020-06-10 MED ORDER — MIDAZOLAM HCL 2 MG/2ML IJ SOLN
INTRAMUSCULAR | Status: AC
  Filled 2020-06-10: qty 2

## 2020-06-10 MED ORDER — ONDANSETRON HCL 4 MG/2ML IJ SOLN
INTRAMUSCULAR | Status: DC | PRN
  Administered 2020-06-10: 4 mg via INTRAVENOUS

## 2020-06-10 MED ORDER — MIDAZOLAM HCL 5 MG/5ML IJ SOLN
INTRAMUSCULAR | Status: DC | PRN
  Administered 2020-06-10: 2 mg via INTRAVENOUS

## 2020-06-10 MED ORDER — AMISULPRIDE (ANTIEMETIC) 5 MG/2ML IV SOLN: 10.0000 mg | Freq: Once | INTRAVENOUS | Status: DC | PRN

## 2020-06-10 MED ORDER — PHENYLEPHRINE 40 MCG/ML (10ML) SYRINGE FOR IV PUSH (FOR BLOOD PRESSURE SUPPORT)
PREFILLED_SYRINGE | INTRAVENOUS | Status: DC | PRN
  Administered 2020-06-10: 120 ug via INTRAVENOUS

## 2020-06-10 MED ORDER — TRAMADOL HCL 50 MG PO TABS
50.0000 mg | ORAL_TABLET | Freq: Once | ORAL | Status: AC
  Administered 2020-06-10: 50 mg via ORAL

## 2020-06-10 MED ORDER — DEXAMETHASONE SODIUM PHOSPHATE 10 MG/ML IJ SOLN
INTRAMUSCULAR | Status: DC | PRN
  Administered 2020-06-10: 5 mg via INTRAVENOUS

## 2020-06-10 MED ORDER — ACETAMINOPHEN 500 MG PO TABS
1000.0000 mg | ORAL_TABLET | Freq: Once | ORAL | Status: AC
  Administered 2020-06-10: 1000 mg via ORAL

## 2020-06-10 MED ORDER — CEFAZOLIN SODIUM-DEXTROSE 2-4 GM/100ML-% IV SOLN
INTRAVENOUS | Status: AC
  Filled 2020-06-10: qty 100

## 2020-06-10 MED ORDER — SODIUM BICARBONATE 4.2 % IV SOLN
INTRAVENOUS | Status: AC
  Filled 2020-06-10: qty 10

## 2020-06-10 MED ORDER — LIDOCAINE HCL (PF) 1 % IJ SOLN
INTRAMUSCULAR | Status: AC
  Filled 2020-06-10: qty 60

## 2020-06-10 MED ORDER — LACTATED RINGERS IV SOLN: INTRAVENOUS | Status: DC

## 2020-06-10 SURGICAL SUPPLY — 28 items
BLADE EXCALIBUR 4.0X13 (MISCELLANEOUS) ×2 IMPLANT
BNDG COHESIVE 6X5 TAN STRL LF (GAUZE/BANDAGES/DRESSINGS) ×2 IMPLANT
COVER WAND RF STERILE (DRAPES) IMPLANT
DISSECTOR 4.0MM X 13CM (MISCELLANEOUS) IMPLANT
DRAPE ARTHROSCOPY W/POUCH 90 (DRAPES) ×2 IMPLANT
DRAPE U-SHAPE 47X51 STRL (DRAPES) ×2 IMPLANT
DRSG EMULSION OIL 3X3 NADH (GAUZE/BANDAGES/DRESSINGS) ×2 IMPLANT
DURAPREP 26ML APPLICATOR (WOUND CARE) ×2 IMPLANT
EXCALIBUR 3.8MM X 13CM (MISCELLANEOUS) IMPLANT
GAUZE SPONGE 4X4 12PLY STRL (GAUZE/BANDAGES/DRESSINGS) ×2 IMPLANT
GLOVE BIOGEL PI IND STRL 9 (GLOVE) ×1 IMPLANT
GLOVE BIOGEL PI INDICATOR 9 (GLOVE) ×1
GLOVE SURG ORTHO 9.0 STRL STRW (GLOVE) ×2 IMPLANT
GOWN STRL REUS W/ TWL LRG LVL3 (GOWN DISPOSABLE) ×1 IMPLANT
GOWN STRL REUS W/ TWL XL LVL3 (GOWN DISPOSABLE) ×1 IMPLANT
GOWN STRL REUS W/TWL LRG LVL3 (GOWN DISPOSABLE) ×2
GOWN STRL REUS W/TWL XL LVL3 (GOWN DISPOSABLE) ×2
KNEE WRAP E Z 3 GEL PACK (MISCELLANEOUS) ×1 IMPLANT
MANIFOLD NEPTUNE II (INSTRUMENTS) IMPLANT
NDL SAFETY ECLIPSE 18X1.5 (NEEDLE) ×1 IMPLANT
NEEDLE HYPO 18GX1.5 SHARP (NEEDLE) ×2
PACK ARTHROSCOPY DSU (CUSTOM PROCEDURE TRAY) ×2 IMPLANT
PACK BASIN DAY SURGERY FS (CUSTOM PROCEDURE TRAY) ×2 IMPLANT
PORT APPOLLO RF 90DEGREE MULTI (SURGICAL WAND) IMPLANT
PROBE APOLLO 90XL (SURGICAL WAND) ×2 IMPLANT
SUT ETHILON 2 0 FSLX (SUTURE) ×2 IMPLANT
TOWEL GREEN STERILE FF (TOWEL DISPOSABLE) ×2 IMPLANT
TUBING ARTHROSCOPY IRRIG 16FT (MISCELLANEOUS) ×2 IMPLANT

## 2020-06-10 NOTE — Op Note (Signed)
06/10/2020  8:19 AM  PATIENT:  Tina [name]    PRE-OPERATIVE DIAGNOSIS: Right knee osteochondral defect and medial meniscal tear  POST-OPERATIVE DIAGNOSIS:  Same  PROCEDURE:  RIGHT KNEE ARTHROSCOPY AND DEBRIDEMENT  SURGEON:  Newt Minion, MD  PHYSICIAN ASSISTANT:None ANESTHESIA:   General  PREOPERATIVE INDICATIONS:  Tina [name] is a  69 y.o. female with a diagnosis of right knee debridement who failed conservative measures and elected for surgical management.    The risks benefits and alternatives were discussed with the patient preoperatively including but not limited to the risks of infection, bleeding, nerve injury, cardiopulmonary complications, the need for revision surgery, among others, and the patient was willing to proceed.  OPERATIVE IMPLANTS: None  @ENCIMAGES @  OPERATIVE FINDINGS: Degenerative tear of the posterior horn of the medial meniscus with large osteochondral defect of the medial femoral condyle and medial tibial plateau.  OPERATIVE PROCEDURE: Patient was brought the operating room and underwent a general anesthetic.  After adequate levels anesthesia were obtained patient's right lower extremity was prepped using DuraPrep draped into a sterile field a timeout was called.  The scope was inserted through the anterior lateral portal and an anterior medial working portal was established.  Examination patient had significant amount of synovitis this was resected with the shaver and the electrocautery was used for hemostasis.  With valgus stress patient had a degenerative tear of the posterior horn of the medial meniscus this was debrided with the shaver.  There was a large osteochondral defect of the medial femoral condyle medial tibial plateau and with the shaver this was debrided back to bleeding viable subchondral bone.  This involved most of the articular surface of the medial femoral condyle medial tibial plateau.  Semination the notch showed intact ACL and PCL.   Semination the lateral joint line in the figure-of-four position showed intact cartilage in the lateral compartment and an intact lateral meniscus.  With the knee extended patient had a large plica which was resected there was significant amount of of synovitis in the medial lateral gutters this was resected.  There were no loose bodies.  Electrocautery was used hemostasis of survey of all compartments again showed there to be no further loose bodies or pathology the instruments were removed the portals were closed using 2-0 nylon and a sterile dressing was applied patient was extubated taken the PACU in stable condition   DISCHARGE PLANNING:  Antibiotic duration: Preoperative antibiotics Weightbearing: WBAT  Pain medication: vicodin  Dressing care/ Wound FIE:PPIRJJ dressing in 2 days  Ambulatory devices:walker, she has  Discharge to: home  Follow-up: In the office 1 week post operative.

## 2020-06-10 NOTE — Anesthesia Procedure Notes (Signed)
Procedure Name: LMA Insertion Date/Time: 06/10/2020 7:35 AM Performed by: Imagene Riches, CRNA Pre-anesthesia Checklist: Patient identified, Emergency Drugs available, Suction available and Patient being monitored Patient Re-evaluated:Patient Re-evaluated prior to induction Oxygen Delivery Method: Circle System Utilized Preoxygenation: Pre-oxygenation with 100% oxygen Induction Type: IV induction Ventilation: Mask ventilation without difficulty LMA: LMA inserted LMA Size: 4.0 Number of attempts: 1 Airway Equipment and Method: Bite block Placement Confirmation: positive ETCO2 Tube secured with: Tape Dental Injury: Teeth and Oropharynx as per pre-operative assessment

## 2020-06-10 NOTE — Discharge Instructions (Signed)
No Tylenol until 3:15 p.m.  Post Anesthesia Home Care Instructions  Activity: Get plenty of rest for the remainder of the day. A responsible individual must stay with you for 24 hours following the procedure.  For the next 24 hours, DO NOT: -Drive a car -Operate machinery -Drink alcoholic beverages -Take any medication unless instructed by your physician -Make any legal decisions or sign important papers.  Meals: Start with liquid foods such as gelatin or soup. Progress to regular foods as tolerated. Avoid greasy, spicy, heavy foods. If nausea and/or vomiting occur, drink only clear liquids until the nausea and/or vomiting subsides. Call your physician if vomiting continues.  Special Instructions/Symptoms: Your throat may feel dry or sore from the anesthesia or the breathing tube placed in your throat during surgery. If this causes discomfort, gargle with warm salt water. The discomfort should disappear within 24 hours.  If you had a scopolamine patch placed behind your ear for the management of post- operative nausea and/or vomiting:  1. The medication in the patch is effective for 72 hours, after which it should be removed.  Wrap patch in a tissue and discard in the trash. Wash hands thoroughly with soap and water. 2. You may remove the patch earlier than 72 hours if you experience unpleasant side effects which may include dry mouth, dizziness or visual disturbances. 3. Avoid touching the patch. Wash your hands with soap and water after contact with the patch.     

## 2020-06-10 NOTE — Anesthesia Preprocedure Evaluation (Signed)
Anesthesia Evaluation  Patient identified by MRN, date of birth, ID band Patient awake    Reviewed: Allergy & Precautions, NPO status , Patient's Chart, lab work & pertinent test results  Airway Mallampati: II  TM Distance: >3 FB Neck ROM: Full    Dental  (+) Dental Advisory Given   Pulmonary COPD, Current Smoker,    breath sounds clear to auscultation       Cardiovascular negative cardio ROS   Rhythm:Regular Rate:Normal     Neuro/Psych  Neuromuscular disease    GI/Hepatic negative GI ROS, Neg liver ROS,   Endo/Other  negative endocrine ROS  Renal/GU negative Renal ROS     Musculoskeletal   Abdominal   Peds  Hematology negative hematology ROS (+)   Anesthesia Other Findings   Reproductive/Obstetrics                             Anesthesia Physical Anesthesia Plan  ASA: III  Anesthesia Plan: General   Post-op Pain Management:    Induction: Intravenous  PONV Risk Score and Plan: 2 and Dexamethasone, Ondansetron and Treatment may vary due to age or medical condition  Airway Management Planned: LMA  Additional Equipment: None  Intra-op Plan:   Post-operative Plan: Extubation in OR  Informed Consent: I have reviewed the patients History and Physical, chart, labs and discussed the procedure including the risks, benefits and alternatives for the proposed anesthesia with the patient or authorized representative who has indicated his/her understanding and acceptance.     Dental advisory given  Plan Discussed with: CRNA  Anesthesia Plan Comments:         Anesthesia Quick Evaluation

## 2020-06-10 NOTE — H&P (Signed)
Tina [name] is an 69 y.o. female.   Chief Complaint: Right Knee Pain HPI: Patient is a 69 year old woman who presents in follow-up status post steroid injection right knee.  Patient states she is still doing poorly she is using a cane for ambulation she states the injection was not very helpful she complains of medial sided joint pain with swelling and pain radiating into her calf.  She states she has decreased range of motion of her knee.  Past Medical History:  Diagnosis Date   Anxiety    Bronchitis    hx   COPD (chronic obstructive pulmonary disease) ([place])    smoker 1ppp   Depression    Hyperlipidemia    Smoker    1ppd    Past Surgical History:  Procedure Laterality Date   ABDOMINAL HYSTERECTOMY     BACK SURGERY     CARPAL TUNNEL RELEASE     rt   CERVICAL DISC SURGERY  12   KNEE ARTHROSCOPY     lft   LUMBAR LAMINECTOMY  09/30/2011   Procedure: MICRODISCECTOMY LUMBAR LAMINECTOMY;  Surgeon: Jessy Oto, MD;  Location: [facility];  Service: Orthopedics;  Laterality: Right;  Right L4-5 Microdiscectomy using MIS approach    History reviewed. No pertinent family history. Social History:  reports that she has been smoking. She has been smoking about 1.00 pack per day. She has never used smokeless tobacco. She reports that she does not drink alcohol and does not use drugs.  Allergies:  Allergies  Allergen Reactions   Dilaudid [Hydromorphone Hcl] Hives   Percocet [Oxycodone-Acetaminophen] Hives   Contrast Media [Iodinated Diagnostic Agents]    Tape Itching    Tape on iv    Medications Prior to Admission  Medication Sig Dispense Refill   ALPRAZolam (XANAX) 0.5 MG tablet Take 0.5 mg by mouth 2 (two) times daily.       atorvastatin (LIPITOR) 10 MG tablet Take 10 mg by mouth daily.       calcium carbonate (OS-CAL) 600 MG TABS Take 600 mg by mouth daily.       desipramine (NORPRAMIN) 50 MG tablet Take 50 mg by mouth daily.       Multiple Vitamins-Minerals  (MULTIVITAMINS THER. W/MINERALS) TABS Take 1 tablet by mouth daily.       sertraline (ZOLOFT) 50 MG tablet Take 175 mg by mouth daily.       vitamin C (ASCORBIC ACID) 500 MG tablet Take 1,000 mg by mouth daily.        No results found for this or any previous visit (from the past 48 hour(s)). No results found.  Review of Systems  All other systems reviewed and are negative.   Blood pressure (!) 144/66, pulse 85, temperature 97.9 F (36.6 C), temperature source Oral, resp. rate 18, height 5\' 3"  (1.6 m), weight 116.8 kg, SpO2 97 %. Physical Exam  Patient is alert, oriented, no adenopathy, well-dressed, normal affect, normal respiratory effort. Examination patient does have venous stasis insufficiency with varicose veins no open ulcers there is an effusion of the left knee collaterals and cruciates are stable. Heart RRR Lungs Clear Assessment/Plan 1. Acute medial meniscus tear of right knee, initial encounter     Plan: We will obtain an MRI scan of her right knee to evaluate the medial meniscus.  Follow-up after the MRI scan is obtained   Bevely Palmer Shataya Winkles, PA 06/10/2020, 6:57 AM

## 2020-06-10 NOTE — Anesthesia Postprocedure Evaluation (Signed)
Anesthesia Post Note  Patient: Infinity Jeffords  Procedure(s) Performed: RIGHT KNEE ARTHROSCOPY AND DEBRIDEMENT (Right Knee)     Patient location during evaluation: PACU Anesthesia Type: General Level of consciousness: awake and alert Pain management: pain level controlled Vital Signs Assessment: post-procedure vital signs reviewed and stable Respiratory status: spontaneous breathing, nonlabored ventilation, respiratory function stable and patient connected to nasal cannula oxygen Cardiovascular status: blood pressure returned to baseline and stable Postop Assessment: no apparent nausea or vomiting Anesthetic complications: no   No complications documented.  Last Vitals:  Vitals:   06/10/20 0915 06/10/20 0955  BP: (!) 143/68 101/82  Pulse:  98  Resp:  15  Temp:  36.5 C  SpO2:  98%    Last Pain:  Vitals:   06/10/20 0945  TempSrc:   PainSc: 5         RLE Motor Response: Purposeful movement;Responds to commands (06/10/20 0955) RLE Sensation: Full sensation (06/10/20 0955)      Tiajuana Amass

## 2020-06-10 NOTE — Transfer of Care (Signed)
Immediate Anesthesia Transfer of Care Note  Patient: Tina [name]  Procedure(s) Performed: RIGHT KNEE ARTHROSCOPY AND DEBRIDEMENT (Right Knee)  Patient Location: PACU  Anesthesia Type:General  Level of Consciousness: drowsy  Airway & Oxygen Therapy: Patient Spontanous Breathing and Patient connected to nasal cannula oxygen  Post-op Assessment: Report given to RN and Post -op Vital signs reviewed and stable  Post vital signs: Reviewed and stable  Last Vitals:  Vitals Value Taken Time  BP    Temp    Pulse 90 06/10/20 0825  Resp 15 06/10/20 0825  SpO2 96 % 06/10/20 0825  Vitals shown include unvalidated device data.  Last Pain:  Vitals:   06/10/20 0651  TempSrc: Oral  PainSc: 0-No pain      Patients Stated Pain Goal: 4 (86/16/83 7290)  Complications: No complications documented.

## 2020-06-11 ENCOUNTER — Encounter (HOSPITAL_BASED_OUTPATIENT_CLINIC_OR_DEPARTMENT_OTHER): Payer: Self-pay | Admitting: Orthopedic Surgery

## 2020-06-18 ENCOUNTER — Encounter: Payer: Self-pay | Admitting: Family

## 2020-06-18 ENCOUNTER — Ambulatory Visit (INDEPENDENT_AMBULATORY_CARE_PROVIDER_SITE_OTHER): Payer: Medicare HMO | Admitting: Family

## 2020-06-18 VITALS — Ht 63.0 in | Wt 257.0 lb

## 2020-06-18 DIAGNOSIS — S83241A Other tear of medial meniscus, current injury, right knee, initial encounter: Secondary | ICD-10-CM

## 2020-06-18 NOTE — Progress Notes (Signed)
   Post-Op Visit Note   Patient: Tina [name]           Date of Birth: 23-Oct-1950           MRN: [id number] Visit Date: 06/18/2020 PCP: Carol Ada, MD  Chief Complaint:  Chief Complaint  Patient presents with  . Right Knee - Routine Post Op    06/10/20 right knee scope and debridement     HPI:  HPI Patient is a 69 year old woman seen 1 week status post right knee arthroscopy with debridement for a meniscal injury.  Complains of some swelling of the knee intermittent warmth over the anterior compartment.  Some swelling and settling into her ankle.  She has been ambulating with a cane she does continue to have some pain wondering how long this postoperative pain will last  Ortho Exam  Sutures in place portals appear to be healing well sutures harvested today without incident.  The knee overall with minimal edema there is a small effusion no erythema or warmth  Visit Diagnoses:  1. Acute medial meniscus tear of right knee, initial encounter     Plan: She will use ibuprofen daily we will back off on her activities minimize her weightbearing of the right knee for the next 2 weeks.  She will follow-up in office in 2 to 3 weeks discussed if her swelling worsens we may consider aspiration at her next visit.  She will gradually increase her activities as she did.  Follow-Up Instructions: Return in about 3 weeks (around 07/09/2020).   Imaging: No results found.  Orders:  No orders of the defined types were placed in this encounter.  No orders of the defined types were placed in this encounter.    PMFS History: Patient Active Problem List   Diagnosis Date Noted  . Old complex tear of medial meniscus of right knee   . Lumbar disc herniation with radiculopathy 09/30/2011    Class: Acute   Past Medical History:  Diagnosis Date  . Anxiety   . Bronchitis    hx  . COPD (chronic obstructive pulmonary disease) ([place])    smoker 1ppp  . Depression   . Hyperlipidemia   . Smoker     1ppd    History reviewed. No pertinent family history.  Past Surgical History:  Procedure Laterality Date  . ABDOMINAL HYSTERECTOMY    . BACK SURGERY    . CARPAL TUNNEL RELEASE     rt  . CERVICAL DISC SURGERY  12  . KNEE ARTHROSCOPY     lft  . KNEE ARTHROSCOPY Right 06/10/2020   Procedure: RIGHT KNEE ARTHROSCOPY AND DEBRIDEMENT;  Surgeon: Newt Minion, MD;  Location: [facility];  Service: Orthopedics;  Laterality: Right;  . LUMBAR LAMINECTOMY  09/30/2011   Procedure: MICRODISCECTOMY LUMBAR LAMINECTOMY;  Surgeon: Jessy Oto, MD;  Location: [facility];  Service: Orthopedics;  Laterality: Right;  Right L4-5 Microdiscectomy using MIS approach   Social History   Occupational History  . Not on file  Tobacco Use  . Smoking status: Current Every Day Smoker    Packs/day: 1.00  . Smokeless tobacco: Never Used  Substance and Sexual Activity  . Alcohol use: No  . Drug use: No  . Sexual activity: Not on file    Comment: hysterectomy

## 2020-07-02 ENCOUNTER — Encounter: Payer: Self-pay | Admitting: Family

## 2020-07-02 ENCOUNTER — Ambulatory Visit (INDEPENDENT_AMBULATORY_CARE_PROVIDER_SITE_OTHER): Payer: Medicare HMO | Admitting: Family

## 2020-07-02 DIAGNOSIS — M23203 Derangement of unspecified medial meniscus due to old tear or injury, right knee: Secondary | ICD-10-CM | POA: Diagnosis not present

## 2020-07-02 MED ORDER — LIDOCAINE HCL 1 % IJ SOLN
5.0000 mL | INTRAMUSCULAR | Status: AC | PRN
  Administered 2020-07-02: 5 mL

## 2020-07-02 MED ORDER — METHYLPREDNISOLONE ACETATE 40 MG/ML IJ SUSP
40.0000 mg | INTRAMUSCULAR | Status: AC | PRN
  Administered 2020-07-02: 40 mg via INTRA_ARTICULAR

## 2020-07-02 NOTE — Progress Notes (Signed)
Post-Op Visit Note   Patient: Tina [name]           Date of Birth: Dec 15, 1950           MRN: [id number] Visit Date: 07/02/2020 PCP: Carol Ada, MD  Chief Complaint:  Chief Complaint  Patient presents with  . Right Knee - Pain    HPI:  HPI Patient is a 70 year old woman seen 3 weeks status post right knee arthroscopy with debridement for a meniscal injury.  Complains of continued swelling, pain, difficulty with ambulation due to pain. Has been using ice and ibu with minimal relief. Frustrated for lack of improvement. Pain with flexion. Fullness posteriorly.   She has been ambulating with a cane   Procedure Note  Patient: Tina [name]             Date of Birth: [birth year]           MRN: [id number]             Visit Date: 07/02/2020  Procedures: Visit Diagnoses: No diagnosis found.  Large Joint Inj: R knee on 07/02/2020 8:45 AM Indications: pain Details: 18 G 1.5 in needle, anteromedial approach Medications: 5 mL lidocaine 1 %; 40 mg methylPREDNISolone acetate 40 MG/ML Aspirate: 0 mL Outcome: tolerated well, no immediate complications Consent was given by the patient.      n wondering how long this postoperative pain will last  Ortho Exam  Portals well healed. No erythema or warmth. Does have small effusion. Tenderness to medial and lateral joint lines. Full rom.   Visit Diagnoses:  No diagnosis found.  Plan: aspiration injection today. Patient tolerated well. Will continue to increase activities as tolerated for next 4 weeks. Follow up in 4 weeks.   Follow-Up Instructions: No follow-ups on file.   Imaging: No results found.  Orders:  No orders of the defined types were placed in this encounter.  No orders of the defined types were placed in this encounter.    PMFS History: Patient Active Problem List   Diagnosis Date Noted  . Old complex tear of medial meniscus of right knee   . Lumbar disc herniation with radiculopathy 09/30/2011    Class: Acute    Past Medical History:  Diagnosis Date  . Anxiety   . Bronchitis    hx  . COPD (chronic obstructive pulmonary disease) ([place])    smoker 1ppp  . Depression   . Hyperlipidemia   . Smoker    1ppd    History reviewed. No pertinent family history.  Past Surgical History:  Procedure Laterality Date  . ABDOMINAL HYSTERECTOMY    . BACK SURGERY    . CARPAL TUNNEL RELEASE     rt  . CERVICAL DISC SURGERY  12  . KNEE ARTHROSCOPY     lft  . KNEE ARTHROSCOPY Right 06/10/2020   Procedure: RIGHT KNEE ARTHROSCOPY AND DEBRIDEMENT;  Surgeon: Newt Minion, MD;  Location: [facility];  Service: Orthopedics;  Laterality: Right;  . LUMBAR LAMINECTOMY  09/30/2011   Procedure: MICRODISCECTOMY LUMBAR LAMINECTOMY;  Surgeon: Jessy Oto, MD;  Location: [facility];  Service: Orthopedics;  Laterality: Right;  Right L4-5 Microdiscectomy using MIS approach   Social History   Occupational History  . Not on file  Tobacco Use  . Smoking status: Current Every Day Smoker    Packs/day: 1.00  . Smokeless tobacco: Never Used  Substance and Sexual Activity  . Alcohol use: No  . Drug use: No  .  Sexual activity: Not on file    Comment: hysterectomy

## 2020-07-30 ENCOUNTER — Ambulatory Visit (INDEPENDENT_AMBULATORY_CARE_PROVIDER_SITE_OTHER): Payer: Medicare HMO | Admitting: Family

## 2020-07-30 ENCOUNTER — Encounter: Payer: Self-pay | Admitting: Family

## 2020-07-30 VITALS — Ht 63.0 in | Wt 257.0 lb

## 2020-07-30 DIAGNOSIS — M23203 Derangement of unspecified medial meniscus due to old tear or injury, right knee: Secondary | ICD-10-CM

## 2020-07-30 DIAGNOSIS — G8929 Other chronic pain: Secondary | ICD-10-CM

## 2020-07-30 DIAGNOSIS — M25561 Pain in right knee: Secondary | ICD-10-CM

## 2020-07-30 MED ORDER — LIDOCAINE HCL 1 % IJ SOLN
5.0000 mL | INTRAMUSCULAR | Status: AC | PRN
  Administered 2020-07-30: 5 mL

## 2020-07-30 MED ORDER — METHYLPREDNISOLONE ACETATE 40 MG/ML IJ SUSP
40.0000 mg | INTRAMUSCULAR | Status: AC | PRN
  Administered 2020-07-30: 40 mg via INTRA_ARTICULAR

## 2020-07-30 NOTE — Progress Notes (Signed)
Post-Op Visit Note   Patient: Tina [name]           Date of Birth: 1950/12/19           MRN: [id number] Visit Date: 07/30/2020 PCP: Carol Ada, MD  Chief Complaint:  Chief Complaint  Patient presents with  . Right Knee - Routine Post Op    06/10/20 right knee scope and debridement     HPI:  HPI   Patient is a 69 year old woman seen 2 months status post right knee arthroscopy with debridement for a meniscal injury.  Complains of continued swelling, pain, difficulty with ambulation due to pain. Has been using icy hot and  ibu with minimal relief. Pain with flexion. Fullness posteriorly.  Has has some improvement in last 4 weeks. Wonders if is related to injection or just passage of time.  Eager to have full pain relief and be able to get back to walking "normally"  Notes has pain and "weird senstations" when blanket or sheets lay on knee. No history of gout.     Procedure Note  Patient: [name]             Date of Birth: 1951-08-10           MRN: [id number]             Visit Date: 07/30/2020  Procedures: Visit Diagnoses:  1. Old complex tear of medial meniscus of right knee     Large Joint Inj: R knee on 07/30/2020 9:14 AM Indications: pain Details: 18 G 1.5 in needle, anteromedial approach Medications: 5 mL lidocaine 1 %; 40 mg methylPREDNISolone acetate 40 MG/ML Consent was given by the patient.     Right Knee Exam   Tenderness  The patient is experiencing tenderness in the medial joint line.  Range of Motion  The patient has normal right knee ROM.  Tests  Varus: negative Valgus: negative  Other  Erythema: absent Scars: present Swelling: moderate      Portals well healed. No erythema or warmth.  Visit Diagnoses:  1. Old complex tear of medial meniscus of right knee     Plan: depomedrol injection today. Will not repeat for another 3 months. Discussed vmo strengthening. Will check uric acid. Follow up in 2 months. Discussed course of  healing and progression of OA knee.   Follow-Up Instructions: No follow-ups on file.   Imaging: No results found.  Orders:  No orders of the defined types were placed in this encounter.  No orders of the defined types were placed in this encounter.    PMFS History: Patient Active Problem List   Diagnosis Date Noted  . Old complex tear of medial meniscus of right knee   . Lumbar disc herniation with radiculopathy 09/30/2011    Class: Acute   Past Medical History:  Diagnosis Date  . Anxiety   . Bronchitis    hx  . COPD (chronic obstructive pulmonary disease) ([place])    smoker 1ppp  . Depression   . Hyperlipidemia   . Smoker    1ppd    History reviewed. No pertinent family history.  Past Surgical History:  Procedure Laterality Date  . ABDOMINAL HYSTERECTOMY    . BACK SURGERY    . CARPAL TUNNEL RELEASE     rt  . CERVICAL DISC SURGERY  12  . KNEE ARTHROSCOPY     lft  . KNEE ARTHROSCOPY Right 06/10/2020   Procedure: RIGHT KNEE ARTHROSCOPY AND DEBRIDEMENT;  Surgeon: Sharol Given,  Illene Regulus, MD;  Location: [facility];  Service: Orthopedics;  Laterality: Right;  . LUMBAR LAMINECTOMY  09/30/2011   Procedure: MICRODISCECTOMY LUMBAR LAMINECTOMY;  Surgeon: Jessy Oto, MD;  Location: [facility];  Service: Orthopedics;  Laterality: Right;  Right L4-5 Microdiscectomy using MIS approach   Social History   Occupational History  . Not on file  Tobacco Use  . Smoking status: Current Every Day Smoker    Packs/day: 1.00  . Smokeless tobacco: Never Used  Substance and Sexual Activity  . Alcohol use: No  . Drug use: No  . Sexual activity: Not on file    Comment: hysterectomy

## 2020-07-31 ENCOUNTER — Telehealth: Payer: Self-pay | Admitting: Orthopedic Surgery

## 2020-07-31 LAB — URIC ACID: Uric Acid, Serum: 5.7 mg/dL (ref 2.5–7.0)

## 2020-07-31 NOTE — Telephone Encounter (Signed)
I called pt and advised of 5.7 uric acid level result.

## 2020-07-31 NOTE — Telephone Encounter (Signed)
Patient called needing the results of her lab work that was done yesterday. The number to contact patient is [phone]

## 2020-08-01 NOTE — Progress Notes (Signed)
Do you mind advising, no gout. Nothing new, will see her as scheduled

## 2020-08-01 NOTE — Progress Notes (Signed)
Pt made aware yesterday.

## 2020-09-30 ENCOUNTER — Ambulatory Visit (INDEPENDENT_AMBULATORY_CARE_PROVIDER_SITE_OTHER): Payer: Medicare HMO | Admitting: Physician Assistant

## 2020-09-30 ENCOUNTER — Ambulatory Visit: Payer: Medicare HMO | Admitting: Family

## 2020-09-30 ENCOUNTER — Encounter: Payer: Self-pay | Admitting: Physician Assistant

## 2020-09-30 VITALS — Ht 63.0 in | Wt 257.0 lb

## 2020-09-30 DIAGNOSIS — Z6841 Body Mass Index (BMI) 40.0 and over, adult: Secondary | ICD-10-CM | POA: Diagnosis not present

## 2020-09-30 DIAGNOSIS — M23203 Derangement of unspecified medial meniscus due to old tear or injury, right knee: Secondary | ICD-10-CM

## 2020-09-30 NOTE — Progress Notes (Signed)
Office Visit Note   Patient: Tina [name]           Date of Birth: 1951/06/12           MRN: [id number] Visit Date: 09/30/2020              Requested by: Merri Brunette, MD [phone] [place],  [place] [address] PCP: Merri Brunette, MD  Chief Complaint  Patient presents with  . Right Knee - Follow-up    06/10/20 right knee scope and debridement  07/30/20 cortisone injection right knee       HPI: Patient presents today for follow-up on her right knee.  She is status post right knee arthroscopy and debridement 4 months ago and status post knee injection 2 months ago.  Overall she says she is doing well.  She does state that she gets some "puffiness in the knee that makes it difficult for her to bend the knee sometimes.  Overall she is much better  Assessment & Plan: Visit Diagnoses: No diagnosis found.  Plan: Discussed with the patient using topical Voltaren gel.  She may also benefit from getting a simple knee sleeve for some compression.  May follow-up for another injection or as needed  Follow-Up Instructions: No follow-ups on file.   Ortho Exam  Patient is alert, oriented, no adenopathy, well-dressed, normal affect, normal respiratory effort. Examination of her right knee demonstrates well-healed surgical portals.  No cellulitis no effusion.  She does have quite a few varicosities in the right lower extremity she has mild soft tissue swelling no evidence of infection.  Imaging: No results found. No images are attached to the encounter.  Labs: Lab Results  Component Value Date   LABURIC 5.7 07/30/2020     Lab Results  Component Value Date   ALBUMIN 3.6 06/23/2011   LABURIC 5.7 07/30/2020    No results found for: MG No results found for: VD25OH  No results found for: PREALBUMIN CBC EXTENDED Latest Ref Rng & Units 10/01/2011 09/24/2011 06/23/2011  WBC 4.0 - 10.5 K/uL 13.1(H) 10.7(H) 10.4  RBC 3.87 - 5.11 MIL/uL 3.63(L) 4.62 4.68  HGB 12.0 -  15.0 g/dL 11.3(L) 15.1(H) 15.0  HCT 36.0 - 46.0 % 33.5(L) 42.5 42.9  PLT 150 - 400 K/uL 233 274 285  NEUTROABS 1.7 - 7.7 K/uL - - 5.2  LYMPHSABS 0.7 - 4.0 K/uL - - 3.9     Body mass index is 45.53 kg/m.  Orders:  No orders of the defined types were placed in this encounter.  No orders of the defined types were placed in this encounter.    Procedures: No procedures performed  Clinical Data: No additional findings.  ROS:  All other systems negative, except as noted in the HPI. Review of Systems  Objective: Vital Signs: Ht 5\' 3"  (1.6 m)   Wt 257 lb (116.6 kg)   BMI 45.53 kg/m   Specialty Comments:  No specialty comments available.  PMFS History: Patient Active Problem List   Diagnosis Date Noted  . Old complex tear of medial meniscus of right knee   . Lumbar disc herniation with radiculopathy 09/30/2011    Class: Acute   Past Medical History:  Diagnosis Date  . Anxiety   . Bronchitis    hx  . COPD (chronic obstructive pulmonary disease) ([facility])    smoker 1ppp  . Depression   . Hyperlipidemia   . Smoker    1ppd    No family history on  file.  Past Surgical History:  Procedure Laterality Date  . ABDOMINAL HYSTERECTOMY    . BACK SURGERY    . CARPAL TUNNEL RELEASE     rt  . CERVICAL DISC SURGERY  12  . KNEE ARTHROSCOPY     lft  . KNEE ARTHROSCOPY Right 06/10/2020   Procedure: RIGHT KNEE ARTHROSCOPY AND DEBRIDEMENT;  Surgeon: Newt Minion, MD;  Location: [facility];  Service: Orthopedics;  Laterality: Right;  . LUMBAR LAMINECTOMY  09/30/2011   Procedure: MICRODISCECTOMY LUMBAR LAMINECTOMY;  Surgeon: Jessy Oto, MD;  Location: [facility];  Service: Orthopedics;  Laterality: Right;  Right L4-5 Microdiscectomy using MIS approach   Social History   Occupational History  . Not on file  Tobacco Use  . Smoking status: Current Every Day Smoker    Packs/day: 1.00  . Smokeless tobacco: Never Used  Substance and Sexual Activity  . Alcohol use: No   . Drug use: No  . Sexual activity: Not on file    Comment: hysterectomy

## 2020-10-30 ENCOUNTER — Ambulatory Visit: Payer: Medicare HMO | Admitting: Physician Assistant

## 2020-12-11 DIAGNOSIS — Z0100 Encounter for examination of eyes and vision without abnormal findings: Secondary | ICD-10-CM | POA: Diagnosis not present

## 2020-12-11 DIAGNOSIS — E7800 Pure hypercholesterolemia, unspecified: Secondary | ICD-10-CM | POA: Diagnosis not present

## 2020-12-11 DIAGNOSIS — H524 Presbyopia: Secondary | ICD-10-CM | POA: Diagnosis not present

## 2021-02-12 DIAGNOSIS — M7989 Other specified soft tissue disorders: Secondary | ICD-10-CM | POA: Diagnosis not present

## 2021-02-18 ENCOUNTER — Other Ambulatory Visit: Payer: Self-pay | Admitting: Family Medicine

## 2021-02-18 DIAGNOSIS — M7989 Other specified soft tissue disorders: Secondary | ICD-10-CM

## 2021-03-03 ENCOUNTER — Ambulatory Visit
Admission: RE | Admit: 2021-03-03 | Discharge: 2021-03-03 | Disposition: A | Discharge status: Home / Self Care | Payer: Medicare HMO | Source: Ambulatory Visit | Attending: Family Medicine | Admitting: Family Medicine

## 2021-03-03 DIAGNOSIS — M7989 Other specified soft tissue disorders: Secondary | ICD-10-CM

## 2021-03-03 DIAGNOSIS — R222 Localized swelling, mass and lump, trunk: Secondary | ICD-10-CM | POA: Diagnosis not present

## 2021-03-13 ENCOUNTER — Other Ambulatory Visit: Payer: Self-pay | Admitting: Family Medicine

## 2021-03-13 DIAGNOSIS — R229 Localized swelling, mass and lump, unspecified: Secondary | ICD-10-CM

## 2021-03-15 ENCOUNTER — Ambulatory Visit
Admission: RE | Admit: 2021-03-15 | Discharge: 2021-03-15 | Disposition: A | Discharge status: Home / Self Care | Payer: Medicare HMO | Source: Ambulatory Visit | Attending: Family Medicine | Admitting: Family Medicine

## 2021-03-15 DIAGNOSIS — N6310 Unspecified lump in the right breast, unspecified quadrant: Secondary | ICD-10-CM | POA: Diagnosis not present

## 2021-03-15 DIAGNOSIS — R222 Localized swelling, mass and lump, trunk: Secondary | ICD-10-CM | POA: Diagnosis not present

## 2021-03-15 DIAGNOSIS — R229 Localized swelling, mass and lump, unspecified: Secondary | ICD-10-CM

## 2021-03-17 ENCOUNTER — Encounter (HOSPITAL_COMMUNITY): Payer: Self-pay | Admitting: Radiology

## 2021-03-17 ENCOUNTER — Other Ambulatory Visit (HOSPITAL_COMMUNITY): Payer: Self-pay | Admitting: Family Medicine

## 2021-03-17 DIAGNOSIS — M7989 Other specified soft tissue disorders: Secondary | ICD-10-CM

## 2021-03-17 NOTE — Progress Notes (Signed)
Patient Name  Tina [name], Tina [name] Legal Sex  Female DOB  06/20/1951 SSN  [id number] Address  [address]  [place] [address] Phone  [phone] (Home)  [phone] (Mobile) *Preferred*     RE: [place] CORE BIOPSY (SOFT TISSUE) Received: Today Arne Cleveland, MD  Arlyn Leak   [place] core biopsy R chest wall mass   DD[facility]         Previous Messages    ----- Message -----  From: Garth Bigness D  Sent: 03/17/2021   9:36 AM EDT  To: [facility] Procedure Requests  Subject: [place] CORE BIOPSY (SOFT TISSUE)                   Procedure:   [place] CORE BIOPSY (SOFT TISSUE)   Reason:  Soft tissue mass, lesion on the right chest wall, ? benign neoplasm or not   History:  MR, [place] in computer   Provider:  Carol Ada   Provider contact:  [phone]

## 2021-03-23 ENCOUNTER — Other Ambulatory Visit: Payer: Self-pay | Admitting: Student

## 2021-03-24 ENCOUNTER — Ambulatory Visit (HOSPITAL_COMMUNITY)
Admission: RE | Admit: 2021-03-24 | Discharge: 2021-03-24 | Disposition: A | Discharge status: Home / Self Care | Payer: Medicare HMO | Source: Ambulatory Visit | Attending: Family Medicine | Admitting: Family Medicine

## 2021-03-24 ENCOUNTER — Other Ambulatory Visit: Payer: Self-pay

## 2021-03-24 ENCOUNTER — Encounter (HOSPITAL_COMMUNITY): Payer: Self-pay

## 2021-03-24 DIAGNOSIS — M7989 Other specified soft tissue disorders: Secondary | ICD-10-CM

## 2021-03-24 DIAGNOSIS — R222 Localized swelling, mass and lump, trunk: Secondary | ICD-10-CM | POA: Diagnosis not present

## 2021-03-24 DIAGNOSIS — F17210 Nicotine dependence, cigarettes, uncomplicated: Secondary | ICD-10-CM | POA: Insufficient documentation

## 2021-03-24 DIAGNOSIS — R897 Abnormal histological findings in specimens from other organs, systems and tissues: Secondary | ICD-10-CM | POA: Diagnosis not present

## 2021-03-24 LAB — CBC
HCT: 46.6 % — ABNORMAL HIGH (ref 36.0–46.0)
Hemoglobin: 15.6 g/dL — ABNORMAL HIGH (ref 12.0–15.0)
MCH: 31.5 pg (ref 26.0–34.0)
MCHC: 33.5 g/dL (ref 30.0–36.0)
MCV: 94.1 fL (ref 80.0–100.0)
Platelets: 295 10*3/uL (ref 150–400)
RBC: 4.95 MIL/uL (ref 3.87–5.11)
RDW: 13.2 % (ref 11.5–15.5)
WBC: 10.3 10*3/uL (ref 4.0–10.5)
nRBC: 0 % (ref 0.0–0.2)

## 2021-03-24 LAB — PROTIME-INR
INR: 1 (ref 0.8–1.2)
Prothrombin Time: 12.8 seconds (ref 11.4–15.2)

## 2021-03-24 MED ORDER — LIDOCAINE-EPINEPHRINE 1 %-1:100000 IJ SOLN
INTRAMUSCULAR | Status: AC | PRN
  Administered 2021-03-24: 10 mL

## 2021-03-24 MED ORDER — SODIUM CHLORIDE 0.9 % IV SOLN: INTRAVENOUS | Status: DC

## 2021-03-24 MED ORDER — FENTANYL CITRATE (PF) 100 MCG/2ML IJ SOLN
INTRAMUSCULAR | Status: AC
  Filled 2021-03-24: qty 2

## 2021-03-24 MED ORDER — LIDOCAINE-EPINEPHRINE 1 %-1:100000 IJ SOLN
INTRAMUSCULAR | Status: AC
  Filled 2021-03-24: qty 1

## 2021-03-24 MED ORDER — FENTANYL CITRATE (PF) 100 MCG/2ML IJ SOLN
INTRAMUSCULAR | Status: AC | PRN
  Administered 2021-03-24: 50 ug via INTRAVENOUS

## 2021-03-24 MED ORDER — MIDAZOLAM HCL 2 MG/2ML IJ SOLN
INTRAMUSCULAR | Status: AC | PRN
  Administered 2021-03-24: 1 mg via INTRAVENOUS

## 2021-03-24 MED ORDER — MIDAZOLAM HCL 2 MG/2ML IJ SOLN
INTRAMUSCULAR | Status: AC
  Filled 2021-03-24: qty 2

## 2021-03-24 NOTE — H&P (Signed)
Chief Complaint: Patient was seen in consultation today for right anterior chest wall mass biopsy at the request of Smith,Candace  Referring Physician(s): Smith,Candace  Supervising Physician: Sandi Mariscal  Patient Status: [facility] - In-pt  History of Present Illness: Tina [name] is a 70 y.o. female   Pt noticed "lump" in anterior chest 3 weeks ago. NT not enlarging ++ smoker  [place] - indeterminate MRI 03/15/21:  IMPRESSION: Subcutaneous, T2 hyperintense/T1 hypointense mass with angulated margins, measuring 4.3 x 2.6 x 1.7 cm, corresponding to the palpable area of concern in the anterior chest. Imaging characteristics are nonspecific on this noncontrast exam, but concerning for neoplasm. Recommend ultrasound-guided biopsy for definitive tissue characterization.    Request made for biopsy  Scheduled today for same   Past Medical History:  Diagnosis Date   Anxiety    Bronchitis    hx   COPD (chronic obstructive pulmonary disease) ([place])    smoker 1ppp   Depression    Hyperlipidemia    Smoker    1ppd    Past Surgical History:  Procedure Laterality Date   ABDOMINAL HYSTERECTOMY     BACK SURGERY     CARPAL TUNNEL RELEASE     rt   CERVICAL DISC SURGERY  12   KNEE ARTHROSCOPY     lft   KNEE ARTHROSCOPY Right 06/10/2020   Procedure: RIGHT KNEE ARTHROSCOPY AND DEBRIDEMENT;  Surgeon: Newt Minion, MD;  Location: [place];  Service: Orthopedics;  Laterality: Right;   LUMBAR LAMINECTOMY  09/30/2011   Procedure: MICRODISCECTOMY LUMBAR LAMINECTOMY;  Surgeon: Jessy Oto, MD;  Location: [place];  Service: Orthopedics;  Laterality: Right;  Right L4-5 Microdiscectomy using MIS approach    Allergies: Dilaudid [hydromorphone hcl], Percocet [oxycodone-acetaminophen], Contrast media [iodinated diagnostic agents], and Tape  Medications: Prior to Admission medications   Medication Sig Start Date End Date Taking? Authorizing Provider  ALPRAZolam Duanne Moron) 0.5 MG  tablet Take 0.5 mg by mouth 2 (two) times daily.   Yes [provider]  atorvastatin (LIPITOR) 10 MG tablet Take 10 mg by mouth daily.   Yes [provider]  CALCIUM PO Take 1 tablet by mouth daily.   Yes [provider]  desipramine (NORPRAMIN) 50 MG tablet Take 50 mg by mouth daily.   Yes [provider]  ibuprofen (ADVIL) 200 MG tablet Take 200 mg by mouth every 6 (six) hours as needed for headache or moderate pain.   Yes [provider]  Multiple Vitamins-Minerals (MULTIVITAMINS THER. W/MINERALS) TABS Take 1 tablet by mouth daily.   Yes [provider]  sertraline (ZOLOFT) 100 MG tablet Take 200 mg by mouth daily.   Yes [provider]  vitamin C (ASCORBIC ACID) 500 MG tablet Take 1,000 mg by mouth daily.   Yes [provider]     History reviewed. No pertinent family history.  Social History   Socioeconomic History   Marital status: Married    Spouse name: Not on file   Number of children: Not on file   Years of education: Not on file   Highest education level: Not on file  Occupational History   Not on file  Tobacco Use   Smoking status: Every Day    Packs/day: 1.00    Pack years: 0.00    Types: Cigarettes   Smokeless tobacco: Never  Substance and Sexual Activity   Alcohol use: No   Drug use: No   Sexual activity: Not on file  Comment: hysterectomy  Other Topics Concern   Not on file  Social History Narrative   Not on file   Social Determinants of Health   Financial Resource Strain: Not on file  Food Insecurity: Not on file  Transportation Needs: Not on file  Physical Activity: Not on file  Stress: Not on file  Social Connections: Not on file     Review of Systems: A 12 point ROS discussed and pertinent positives are indicated in the HPI above.  All other systems are negative.  Review of Systems  Constitutional:  Negative for activity change, fatigue, fever and unexpected weight change.   Respiratory:  Negative for cough and shortness of breath.   Cardiovascular:  Negative for chest pain.  Gastrointestinal:  Negative for abdominal pain.  Psychiatric/Behavioral:  Negative for behavioral problems and confusion.    Vital Signs: BP 125/61   Pulse 87   Temp 98.1 F (36.7 C) (Oral)   Resp 16   Ht 5\' 3"  (1.6 m)   Wt 250 lb (113.4 kg)   SpO2 99%   BMI 44.29 kg/m   Physical Exam Vitals reviewed.  HENT:     Mouth/Throat:     Mouth: Mucous membranes are moist.  Cardiovascular:     Rate and Rhythm: Normal rate and regular rhythm.     Heart sounds: Normal heart sounds.  Pulmonary:     Effort: Pulmonary effort is normal.     Breath sounds: Normal breath sounds.  Abdominal:     Palpations: Abdomen is soft.  Musculoskeletal:        General: Normal range of motion.     Comments: Small palpable mass palpable at Rt of xyphoid area Minimally tender  Skin:    General: Skin is warm.  Neurological:     Mental Status: She is alert and oriented to person, place, and time.  Psychiatric:        Behavior: Behavior normal.    Imaging: MR CHEST WO CONTRAST  Result Date: 03/16/2021 CLINICAL DATA:  Mass on right breast area EXAM: MR CHEST WITHOUT CONTRAST TECHNIQUE: Multiplanar, multisequence MR imaging of the chest was performed. No intravenous contrast was administered. COMPARISON:  Ultrasound 03/03/2021 FINDINGS: Bones/Joint/Cartilage: The cortex is intact. There is no significant marrow signal alteration. Ligaments: Grossly intact. Muscles and Tendons: Generalized muscle atrophy. Soft tissue: Within the palpable area of concern along the right lower anterior chest, there is a subcutaneous T2 hyperintense, T1 hypointense mass with angulated margins measuring 4.3 x 2.6 x 1.7 cm (T2 fat-sat coronal image 7, axial image 11.) IMPRESSION: Subcutaneous, T2 hyperintense/T1 hypointense mass with angulated margins, measuring 4.3 x 2.6 x 1.7 cm, corresponding to the palpable area of concern  in the anterior chest. Imaging characteristics are nonspecific on this noncontrast exam, but concerning for neoplasm. Recommend ultrasound-guided biopsy for definitive tissue characterization. Electronically Signed   By: Maurine Simmering   On: 03/16/2021 11:47   [place] CHEST SOFT TISSUE  Result Date: 03/03/2021 CLINICAL DATA:  70 year old female with soft tissue mass in the lower anterior chest wall. EXAM: ULTRASOUND OF chest wall SOFT TISSUES TECHNIQUE: Ultrasound examination was performed in the area of clinical concern. COMPARISON:  None. FINDINGS: Targeted sonographic images of the anterior lower chest in the subcostal/epigastric region performed using grayscale and color Doppler. There is a 2.9 x 1.7 x 2.2 cm complex hypoechoic lesion with irregular and lobulated margins. Linear echogenic bands noted within this lesion. There is some vascularity in the periphery of this lesion. Although this  may represent a complex fluid collection a neoplastic mass is not excluded. Further characterization with MRI without and with contrast is recommended. IMPRESSION: Indeterminate complex hypoechoic lesion in the anterior chest wall. Further characterization with MRI is recommended. Electronically Signed   By: Anner Crete M.D.   On: 03/03/2021 19:51    Labs:  CBC: No results for input(s): WBC, HGB, HCT, PLT in the last 8760 hours.  COAGS: No results for input(s): INR, APTT in the last 8760 hours.  BMP: No results for input(s): NA, K, CL, CO2, GLUCOSE, BUN, CALCIUM, CREATININE, GFRNONAA, GFRAA in the last 8760 hours.  Invalid input(s): CMP  LIVER FUNCTION TESTS: No results for input(s): BILITOT, AST, ALT, ALKPHOS, PROT, ALBUMIN in the last 8760 hours.  TUMOR MARKERS: No results for input(s): AFPTM, CEA, CA199, CHROMGRNA in the last 8760 hours.  Assessment and Plan:  Scheduled for right anterior chest wall mass biopsy Risks and benefits of right anterior chest wall mass biopsy was discussed with the  patient and/or patient's family including, but not limited to bleeding, infection, damage to adjacent structures or low yield requiring additional tests.  All of the questions were answered and there is agreement to proceed. Consent signed and in chart.   Thank you for this interesting consult.  I greatly enjoyed meeting Tina [name] and look forward to participating in their care.  A copy of this report was sent to the requesting provider on this date.  Electronically Signed: Lavonia Drafts, PA-C 03/24/2021, 7:11 AM   I spent a total of  30 Minutes   in face to face in clinical consultation, greater than 50% of which was counseling/coordinating care for right anterior chest wall mass bx

## 2021-03-24 NOTE — Procedures (Signed)
Pre Procedure Dx: Indeterminate subcutaneous mass within the ventral upper abdominal wall  Post Procedural Dx: Same  Technically successful US guided biopsy of indeterminate subcutaneous mass within the ventral upper abdominal wall   EBL: None No immediate complications.   Ronny Bacon, MD Pager #: [phone]

## 2021-03-27 LAB — SURGICAL PATHOLOGY

## 2021-03-30 ENCOUNTER — Telehealth: Payer: Self-pay | Admitting: Hematology and Oncology

## 2021-03-30 NOTE — Telephone Encounter (Signed)
Received a new pt referral from Dr. Tamala Julian for suspicion for nhl. Tina [name] has been cld and scheduled to see Dr. Lorenso Courier on 7/1 at [facility]. Pt aware to arrive 20 minutes early.

## 2021-04-03 ENCOUNTER — Inpatient Hospital Stay: Payer: Medicare HMO | Attending: Hematology and Oncology | Admitting: Hematology and Oncology

## 2021-04-03 ENCOUNTER — Inpatient Hospital Stay: Payer: Medicare HMO

## 2021-04-03 ENCOUNTER — Other Ambulatory Visit: Payer: Self-pay

## 2021-04-03 VITALS — BP 156/64 | HR 110 | Temp 97.3°F | Resp 19 | Ht 63.0 in | Wt 250.8 lb

## 2021-04-03 DIAGNOSIS — Z885 Allergy status to narcotic agent status: Secondary | ICD-10-CM | POA: Insufficient documentation

## 2021-04-03 DIAGNOSIS — F17210 Nicotine dependence, cigarettes, uncomplicated: Secondary | ICD-10-CM | POA: Diagnosis not present

## 2021-04-03 DIAGNOSIS — E785 Hyperlipidemia, unspecified: Secondary | ICD-10-CM | POA: Insufficient documentation

## 2021-04-03 DIAGNOSIS — C8293 Follicular lymphoma, unspecified, intra-abdominal lymph nodes: Secondary | ICD-10-CM | POA: Insufficient documentation

## 2021-04-03 DIAGNOSIS — C8290 Follicular lymphoma, unspecified, unspecified site: Secondary | ICD-10-CM

## 2021-04-03 DIAGNOSIS — J449 Chronic obstructive pulmonary disease, unspecified: Secondary | ICD-10-CM | POA: Insufficient documentation

## 2021-04-03 DIAGNOSIS — F419 Anxiety disorder, unspecified: Secondary | ICD-10-CM | POA: Insufficient documentation

## 2021-04-03 DIAGNOSIS — F32A Depression, unspecified: Secondary | ICD-10-CM | POA: Diagnosis not present

## 2021-04-03 DIAGNOSIS — Z79899 Other long term (current) drug therapy: Secondary | ICD-10-CM | POA: Diagnosis not present

## 2021-04-03 DIAGNOSIS — Z888 Allergy status to other drugs, medicaments and biological substances status: Secondary | ICD-10-CM | POA: Diagnosis not present

## 2021-04-03 LAB — CMP (CANCER CENTER ONLY)
ALT: 15 U/L (ref 0–44)
AST: 17 U/L (ref 15–41)
Albumin: 3.6 g/dL (ref 3.5–5.0)
Alkaline Phosphatase: 85 U/L (ref 38–126)
Anion gap: 10 (ref 5–15)
BUN: 15 mg/dL (ref 8–23)
CO2: 24 mmol/L (ref 22–32)
Calcium: 9.6 mg/dL (ref 8.9–10.3)
Chloride: 106 mmol/L (ref 98–111)
Creatinine: 0.94 mg/dL (ref 0.44–1.00)
GFR, Estimated: >60 mL/min (ref >60)
Glucose, Bld: 101 mg/dL — ABNORMAL HIGH (ref 70–99)
Potassium: 4.3 mmol/L (ref 3.5–5.1)
Sodium: 140 mmol/L (ref 135–145)
Total Bilirubin: 0.4 mg/dL (ref 0.3–1.2)
Total Protein: 7.3 g/dL (ref 6.5–8.1)

## 2021-04-03 LAB — CBC WITH DIFFERENTIAL (CANCER CENTER ONLY)
Abs Immature Granulocytes: 0.01 10*3/uL (ref 0.00–0.07)
Basophils Absolute: 0.1 10*3/uL (ref 0.0–0.1)
Basophils Relative: 1 %
Eosinophils Absolute: 0.2 10*3/uL (ref 0.0–0.5)
Eosinophils Relative: 2 %
HCT: 43.9 % (ref 36.0–46.0)
Hemoglobin: 15.1 g/dL — ABNORMAL HIGH (ref 12.0–15.0)
Immature Granulocytes: 0 %
Lymphocytes Relative: 31 %
Lymphs Abs: 2.9 10*3/uL (ref 0.7–4.0)
MCH: 31.7 pg (ref 26.0–34.0)
MCHC: 34.4 g/dL (ref 30.0–36.0)
MCV: 92 fL (ref 80.0–100.0)
Monocytes Absolute: 0.7 10*3/uL (ref 0.1–1.0)
Monocytes Relative: 7 %
Neutro Abs: 5.4 10*3/uL (ref 1.7–7.7)
Neutrophils Relative %: 59 %
Platelet Count: 252 10*3/uL (ref 150–400)
RBC: 4.77 MIL/uL (ref 3.87–5.11)
RDW: 13 % (ref 11.5–15.5)
WBC Count: 9.1 10*3/uL (ref 4.0–10.5)
nRBC: 0 % (ref 0.0–0.2)

## 2021-04-03 LAB — HEPATITIS B CORE ANTIBODY, TOTAL: Hep B Core Total Ab: NONREACTIVE

## 2021-04-03 LAB — LACTATE DEHYDROGENASE: LDH: 138 U/L (ref 98–192)

## 2021-04-03 LAB — HEPATITIS B SURFACE ANTIGEN: Hepatitis B Surface Ag: NONREACTIVE

## 2021-04-03 LAB — HEPATITIS B SURFACE ANTIBODY,QUALITATIVE: Hep B S Ab: NONREACTIVE

## 2021-04-03 LAB — HEPATITIS C ANTIBODY: HCV Ab: NONREACTIVE

## 2021-04-03 LAB — URIC ACID: Uric Acid, Serum: 5.8 mg/dL (ref 2.5–7.1)

## 2021-04-07 ENCOUNTER — Telehealth: Payer: Self-pay | Admitting: *Deleted

## 2021-04-07 NOTE — Telephone Encounter (Signed)
Patient called to get lab results.  She has mychart and advised all labs are normal.   She is scheduled for PET scan on July 22nd.

## 2021-04-14 ENCOUNTER — Encounter: Payer: Self-pay | Admitting: Hematology and Oncology

## 2021-04-14 NOTE — Progress Notes (Signed)
[place] [phone]   Fax:([phone] [place] NOTE  Patient Care Team: Carol Ada, MD as PCP - General (Family Medicine)  Hematological/Oncological History # Non-Hodgkin Lymphoma, Consistent with [place]. Staging/Diagnosis in Process.  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 04/03/2021: establish care with Dr. Lorenso Courier  CHIEF COMPLAINTS/PURPOSE OF CONSULTATION:  "Non-Hodgkin Lymphoma "  HISTORY OF PRESENTING ILLNESS:  Tina [name] 70 y.o. female with medical history significant for COPD, HLD, depression, and anxiety presents with a new abdominal lesion concerning for NHL.   On review of the previous records Tina [name] underwent a biopsy of a lesion that developed on her abdomen on 03/24/2021.  The biopsy of this mass revealed an atypical lymphoid infiltrate suspicious for non-Hodgkin B-cell lymphoma.  Overall morphologic and immunophenotypic features concerning for non-Hodgkin B-cell lymphoma particularly follicle center origin.  The patient was referred to hematology for further evaluation and management.  On exam today Tina [name] reports that the lump in her abdomen developed about 6 weeks ago.  She notes that since that time it is not gotten any bigger or changed.  She notes that there is another 1 that she believes that she feels on her right side but is more difficult to palpate.  She reports that she is lost 20 pounds in the last 1.5 years and thinks that this may be due to a change in her eating habits.  She has no personal history of malignancy.  On further discussion her family history is remarkable for Hodgkin's lymphoma in her father and Alzheimer's in her mother.  She  has 2 children who are healthy.  She does that she is an active smoker and is currently smoking 1 pack/day and is doing her best to quit.  She does not drink any alcohol and currently works in Therapist, art.  She otherwise denies any fevers, chills, sweats, nausea, vomiting or diarrhea.  A full 10 point ROS is listed below.  MEDICAL HISTORY:  Past Medical History:  Diagnosis Date   Anxiety    Bronchitis    hx   COPD (chronic obstructive pulmonary disease) ([place])    smoker 1ppp   Depression    Hyperlipidemia    Smoker    1ppd    SURGICAL HISTORY: Past Surgical History:  Procedure Laterality Date   ABDOMINAL HYSTERECTOMY     BACK SURGERY     CARPAL TUNNEL RELEASE     rt   CERVICAL DISC SURGERY  12   KNEE ARTHROSCOPY     lft   KNEE ARTHROSCOPY Right 06/10/2020   Procedure: RIGHT KNEE ARTHROSCOPY AND DEBRIDEMENT;  Surgeon: Newt Minion, MD;  Location: [place];  Service: Orthopedics;  Laterality: Right;   LUMBAR LAMINECTOMY  09/30/2011   Procedure: MICRODISCECTOMY LUMBAR LAMINECTOMY;  Surgeon: Jessy Oto, MD;  Location: [place];  Service: Orthopedics;  Laterality: Right;  Right L4-5 Microdiscectomy using MIS approach    SOCIAL HISTORY: Social History   Socioeconomic History   Marital status: Married    Spouse name: Not on file   Number of children: Not on file   Years of education: Not on file   Highest education level: Not on file  Occupational History   Not on file  Tobacco Use   Smoking status:  Every Day    Packs/day: 1.00    Pack years: 0.00    Types: Cigarettes   Smokeless tobacco: Never  Substance and Sexual Activity   Alcohol use: No   Drug use: No   Sexual activity: Not on file    Comment: hysterectomy  Other Topics Concern   Not on file  Social History Narrative   Not on file   Social Determinants of Health   Financial Resource Strain: Not on file  Food Insecurity: Not on file  Transportation Needs: Not on file  Physical  Activity: Not on file  Stress: Not on file  Social Connections: Not on file  Intimate Partner Violence: Not on file    FAMILY HISTORY: No family history on file.  ALLERGIES:  is allergic to dilaudid [hydromorphone hcl], percocet [oxycodone-acetaminophen], contrast media [iodinated diagnostic agents], and tape.  MEDICATIONS:  Current Outpatient Medications  Medication Sig Dispense Refill   ALPRAZolam (XANAX) 0.5 MG tablet Take 0.5 mg by mouth 2 (two) times daily.     atorvastatin (LIPITOR) 10 MG tablet Take 10 mg by mouth daily.     CALCIUM PO Take 1 tablet by mouth daily.     desipramine (NORPRAMIN) 50 MG tablet Take 50 mg by mouth daily.     ibuprofen (ADVIL) 200 MG tablet Take 200 mg by mouth every 6 (six) hours as needed for headache or moderate pain.     Multiple Vitamins-Minerals (MULTIVITAMINS THER. W/MINERALS) TABS Take 1 tablet by mouth daily.     sertraline (ZOLOFT) 100 MG tablet Take 200 mg by mouth daily.     vitamin C (ASCORBIC ACID) 500 MG tablet Take 1,000 mg by mouth daily.     No current facility-administered medications for this visit.    REVIEW OF SYSTEMS:   Constitutional: ( - ) fevers, ( - )  chills , ( - ) night sweats Eyes: ( - ) blurriness of vision, ( - ) double vision, ( - ) watery eyes Ears, nose, mouth, throat, and face: ( - ) mucositis, ( - ) sore throat Respiratory: ( - ) cough, ( - ) dyspnea, ( - ) wheezes Cardiovascular: ( - ) palpitation, ( - ) chest discomfort, ( - ) lower extremity swelling Gastrointestinal:  ( - ) nausea, ( - ) heartburn, ( - ) change in bowel habits Skin: ( - ) abnormal skin rashes Lymphatics: ( - ) new lymphadenopathy, ( - ) easy bruising Neurological: ( - ) numbness, ( - ) tingling, ( - ) new weaknesses Behavioral/Psych: ( - ) mood change, ( - ) new changes  All other systems were reviewed with the patient and are negative.  PHYSICAL EXAMINATION: ECOG PERFORMANCE STATUS: 1 - Symptomatic but completely ambulatory  Vitals:    04/03/21 0841  BP: (!) 156/64  Pulse: (!) 110  Resp: 19  Temp: (!) 97.3 F (36.3 C)  SpO2: 99%   Filed Weights   04/03/21 0841  Weight: 250 lb 12.8 oz (113.8 kg)    GENERAL: well appearing elderly Caucasian female in NAD  SKIN: skin color, texture, turgor are normal, no rashes or significant lesions EYES: conjunctiva are pink and non-injected, sclera clear OROPHARYNX: no exudate, no erythema; lips, buccal mucosa, and tongue normal  NECK: supple, non-tender LYMPH:  no palpable lymphadenopathy in the cervical, axillary or supraclavicular lymph nodes.  LUNGS: clear to auscultation and percussion with normal breathing effort HEART: regular rate & rhythm and no murmurs and no lower extremity edema ABDOMEN: midline firm  abdominal mass. Otherwise soft, non-tender, non-distended, normal bowel sounds Musculoskeletal: no cyanosis of digits and no clubbing  PSYCH: alert & oriented x 3, fluent speech NEURO: no focal motor/sensory deficits  LABORATORY DATA:  I have reviewed the data as listed CBC Latest Ref Rng & Units 04/03/2021 03/24/2021 10/01/2011  WBC 4.0 - 10.5 K/uL 9.1 10.3 13.1(H)  Hemoglobin 12.0 - 15.0 g/dL 15.1(H) 15.6(H) 11.3(L)  Hematocrit 36.0 - 46.0 % 43.9 46.6(H) 33.5(L)  Platelets 150 - 400 K/uL 252 295 233    CMP Latest Ref Rng & Units 04/03/2021 09/24/2011 06/23/2011  Glucose 70 - 99 mg/dL 101(H) 83 83  BUN 8 - 23 mg/dL 15 10 13   Creatinine 0.44 - 1.00 mg/dL 0.94 0.78 0.81  Sodium 135 - 145 mmol/L 140 139 140  Potassium 3.5 - 5.1 mmol/L 4.3 4.1 3.8  Chloride 98 - 111 mmol/L 106 102 101  CO2 22 - 32 mmol/L 24 29 30   Calcium 8.9 - 10.3 mg/dL 9.6 10.0 9.9  Total Protein 6.5 - 8.1 g/dL 7.3 - 7.0  Total Bilirubin 0.3 - 1.2 mg/dL 0.4 - 0.2(L)  Alkaline Phos 38 - 126 U/L 85 - 96  AST 15 - 41 U/L 17 - 18  ALT 0 - 44 U/L 15 - 19    RADIOGRAPHIC STUDIES: [place] CORE BIOPSY (SOFT TISSUE)  Result Date: 03/24/2021 INDICATION: Indeterminate mass within the subcutaneous tissues  of the ventral aspect of the upper abdomen. Please perform image guided biopsy for tissue diagnostic purposes. EXAM: ULTRASOUND-GUIDED BIOPSY OF INDETERMINATE MASS WITH THE SUBCUTANEOUS TISSUES OF THE VENTRAL ASPECT OF THE UPPER ABDOMEN COMPARISON:  Chest MRI-03/15/2021; chest ultrasound-03/03/2021 MEDICATIONS: None ANESTHESIA/SEDATION: Moderate (conscious) sedation was employed during this procedure. A total of Versed 1 mg and Fentanyl 50 mcg was administered intravenously. Moderate Sedation Time: 11 minutes. The patient's level of consciousness and vital signs were monitored continuously by radiology nursing throughout the procedure under my direct supervision. COMPLICATIONS: None immediate. TECHNIQUE: Informed written consent was obtained from the patient after a discussion of the risks, benefits and alternatives to treatment. Questions regarding the procedure were encouraged and answered. Initial ultrasound scanning demonstrated a grossly unchanged approximately 2.6 x 1.5 cm spiculated hypoechoic nodule within the subcutaneous tissues of the right-side of superior ventral abdominal wall (image 2). An ultrasound image was saved for documentation purposes. The procedure was planned. A timeout was performed prior to the initiation of the procedure. The operative was prepped and draped in the usual sterile fashion, and a sterile drape was applied covering the operative field. A timeout was performed prior to the initiation of the procedure. Local anesthesia was provided with 1% lidocaine with epinephrine. Under direct ultrasound guidance, an 18 gauge core needle device was utilized to obtain to obtain 6 core needle biopsies of the spiculated subcutaneous nodule. The samples were placed in saline and submitted to pathology. The needle was removed and hemostasis was achieved with manual compression. Post procedure scan was negative for significant hematoma. A dressing was placed. The patient tolerated the procedure  well without immediate postprocedural complication. IMPRESSION: Technically successful ultrasound guided biopsy of indeterminate spiculated nodule within the subcutaneous tissues of the ventral aspect of the right upper abdomen. Electronically Signed   By: Sandi Mariscal M.D.   On: 03/24/2021 14:33    ASSESSMENT & PLAN Tina [name] 70 y.o. female with medical history significant for COPD, HLD, depression, and anxiety presents with a new abdominal lesion concerning for NHL.   After review of the labs,  review of the records, and discussion with the patient the patients findings are most consistent with a non-Hodgkin's lymphoma.  At this time it is difficult to say exactly which type of lymphoma this is no pathology currently favors a follicle center origin.  In order to confirm the diagnosis I recommend that we perform a PET CT scan to look for other involved lymph nodes.  If there is another involved lymph node we could consider an excisional biopsy in order to help confirm the exact diagnosis.  Today we will order a baseline labs for lymphoma as well as a PET CT scan.  An echocardiogram could be considered if there is concern that this may be a more aggressive lymphoma.  Once these results have returned we will have the patient return to clinic to discuss further management.  # Non-Hodgkin Lymphoma, Consistent with [place]. Staging/Diagnosis in Process.  --Baseline labs today to include CBC, CMP, LDH, hepatitis serologies, and uric acid --Staging and site for new lymph node biopsy to be performed with a PET CT scan --Pending the results of the PET CT scan we will have the patient return to clinic to discuss options moving forward.  Orders Placed This Encounter  Procedures   NM PET Image Initial (PI) Skull Base To Thigh    Standing Status:   Future    Standing Expiration Date:   04/03/2022    Order Specific Question:   If indicated for the ordered procedure, I authorize the administration  of a radiopharmaceutical per Radiology protocol    Answer:   Yes    Order Specific Question:   Preferred imaging location?    Answer:   [facility]   CBC with Differential (Cancer Center Only)    Standing Status:   Future    Number of Occurrences:   1    Standing Expiration Date:   04/03/2022   Lactate dehydrogenase (LDH)    Standing Status:   Future    Number of Occurrences:   1    Standing Expiration Date:   04/03/2022   CMP (Cancer Center only)    Standing Status:   Future    Number of Occurrences:   1    Standing Expiration Date:   04/03/2022   Hepatitis C antibody    Standing Status:   Future    Number of Occurrences:   1    Standing Expiration Date:   04/03/2022   Hepatitis B surface antibody    Standing Status:   Future    Number of Occurrences:   1    Standing Expiration Date:   04/03/2022   Hepatitis B surface antigen    Standing Status:   Future    Number of Occurrences:   1    Standing Expiration Date:   04/03/2022   Hepatitis B core antibody, total    Standing Status:   Future    Number of Occurrences:   1    Standing Expiration Date:   04/03/2022   Uric acid    Standing Status:   Future    Number of Occurrences:   1    Standing Expiration Date:   04/03/2022   ECHOCARDIOGRAM COMPLETE    Standing Status:   Future    Standing Expiration Date:   04/14/2022    Order Specific Question:   Where should this test be performed    Answer:   [facility]    Order Specific Question:   Perflutren DEFINITY (image enhancing  agent) should be administered unless hypersensitivity or allergy exist    Answer:   Administer Perflutren    Order Specific Question:   Does this study need to be read by the Structural team/Level 3 readers?    Answer:   No    Order Specific Question:   Reason for exam-Echo    Answer:   Chemo  Z09    All questions were answered. The patient knows to call the clinic with any problems, questions or concerns.  A total of more than 60 minutes were spent on this encounter  with face-to-face time and non-face-to-face time, including preparing to see the patient, ordering tests and/or medications, counseling the patient and coordination of care as outlined above.   Ledell Peoples, MD Department of Hematology/Oncology [facility] Phone: [phone] Pager: [phone] Email: [email or web address]  04/14/2021 12:09 PM

## 2021-04-24 ENCOUNTER — Ambulatory Visit (HOSPITAL_COMMUNITY)
Admission: RE | Admit: 2021-04-24 | Discharge: 2021-04-24 | Disposition: A | Discharge status: Home / Self Care | Payer: Medicare HMO | Source: Ambulatory Visit | Attending: Hematology and Oncology | Admitting: Hematology and Oncology

## 2021-04-24 ENCOUNTER — Telehealth: Payer: Self-pay | Admitting: *Deleted

## 2021-04-24 ENCOUNTER — Other Ambulatory Visit: Payer: Self-pay

## 2021-04-24 DIAGNOSIS — K5730 Diverticulosis of large intestine without perforation or abscess without bleeding: Secondary | ICD-10-CM | POA: Insufficient documentation

## 2021-04-24 DIAGNOSIS — I700 Atherosclerosis of aorta: Secondary | ICD-10-CM | POA: Insufficient documentation

## 2021-04-24 DIAGNOSIS — J439 Emphysema, unspecified: Secondary | ICD-10-CM | POA: Diagnosis not present

## 2021-04-24 DIAGNOSIS — R222 Localized swelling, mass and lump, trunk: Secondary | ICD-10-CM | POA: Insufficient documentation

## 2021-04-24 DIAGNOSIS — C8512 Unspecified B-cell lymphoma, intrathoracic lymph nodes: Secondary | ICD-10-CM | POA: Diagnosis not present

## 2021-04-24 DIAGNOSIS — C8290 Follicular lymphoma, unspecified, unspecified site: Secondary | ICD-10-CM | POA: Diagnosis not present

## 2021-04-24 DIAGNOSIS — I2510 Atherosclerotic heart disease of native coronary artery without angina pectoris: Secondary | ICD-10-CM | POA: Insufficient documentation

## 2021-04-24 LAB — GLUCOSE, CAPILLARY: Glucose-Capillary: 123 mg/dL — ABNORMAL HIGH (ref 70–99)

## 2021-04-24 MED ORDER — FLUDEOXYGLUCOSE F - 18 (FDG) INJECTION
12.4000 | Freq: Once | INTRAVENOUS | Status: AC | PRN
  Administered 2021-04-24: 12.4 via INTRAVENOUS

## 2021-04-24 NOTE — Telephone Encounter (Signed)
Received call from patient inquiring about her PET scan results. Advised that the results have not been posted by the radiologist yet. Advised that Dr. Lorenso Courier would likely get them over the weekend or Monday and will contact her with those results.  She voiced understanding

## 2021-04-27 ENCOUNTER — Telehealth: Payer: Self-pay | Admitting: Hematology and Oncology

## 2021-04-27 ENCOUNTER — Telehealth: Payer: Self-pay | Admitting: *Deleted

## 2021-04-27 DIAGNOSIS — C8290 Follicular lymphoma, unspecified, unspecified site: Secondary | ICD-10-CM

## 2021-04-27 NOTE — Telephone Encounter (Signed)
Ed vm message from pt calling back about her PET scan results-asking to speak to Dr. Lorenso Courier  Pt requests a call back

## 2021-04-27 NOTE — Telephone Encounter (Signed)
Called Tina [name] to discuss the results of her PET CT scan.  Findings are most consistent with a single FDG avid area in the subcutaneous tissue in the chest.  There is no evidence of greater systemic disease based on the PET CT scan and her lab work.  Given this I do believe that she would be an excellent candidate for radiation therapy.  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  We will put in a referral to radiation oncology for consideration of radiation therapy to this lesion.  Afterwards we will continue to follow the patient to assess for recurrence.   Literature Support:  Moises Blood DT. Primary Cutaneous Follicle Center Lymphoma. [Updated 2022 Jan 24]. In: [employer] [Internet]. [facility]): [employer]; 2022 Jan-. Available from: [email or web address].be   Ledell Peoples, MD Department of Hematology/Oncology [place] [facility] Phone: [phone] Pager: [phone] Email: [email or web address]

## 2021-04-28 NOTE — Progress Notes (Signed)
Radiation Oncology         [phone] ________________________________  Initial Outpatient Consultation  Name: Tina [name] MRN: [id number]  Date: 04/29/2021  DOB: 01-21-1951  [id number], Hal Hope, MD  Orson Slick, MD   REFERRING PHYSICIAN: Orson Slick, MD  DIAGNOSIS:  C85.10   ICD-10-CM   1. 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   HISTORY OF PRESENT ILLNESS::Tina [name] is a 70 y.o. female who presented with an abdominal mass. Mass was first visualized on an ultrasound of the chest on 03/03/21 which demonstrated an indeterminate complex hypoechoic lesion in the anterior chest wall. MRI of the chest on 03/15/21 further revealed the subcutaneous mass to have angulated margins; measuring 4.3 x 2.6 x 1.7 cm and corresponding to the palpable area of concern in the anterior chest. Results were noted to be concerning for neoplasm.  Biopsy of subcutaneous mass in abdominal wall on 03/24/21 revealed an atypical lymphoid infiltrate suspicious for non-Hodgkin B-cell lymphoma.   The patient was referred by Dr. Tamala Julian for consultation with Dr. Lorenso Courier on 04/03/21. On physical exam, the patient reported that the lump in her abdomen developed around 6 weeks ago. She notes that it has since remained unchanged in size and appearance. Additionally, the patient noted an additional lump that she believes she feels on her right side, although it is difficult to palpate. The  patient states that she has lost 20 pounds in the last 1.5 years and thinks that this may be due to a change in her eating habits. Otherwise, the patient denied any fevers, chills, sweats, nausea, vomiting or diarrhea.  PET scan performed on 04/24/21 demonstrated: known mass measuring 2.7 x 1.7 cm in the subcutaneous tissues of the right chest; with max SUV of 7.8. No other hypermetabolic lesions were identified.    Weight changes, if any, over the past 6 months: Reports that she is lost ~20 pounds in the last 1.5 years, and thinks that this may be due to a change in her eating habits. Reports occasional bouts of constipation, but states she's had regular daily bowel movements as of late. Denies any changes/concerns in urinary habits  Recurrent fevers, or drenching night sweats, if any: Patient denies  SAFETY ISSUES: Prior radiation? No Pacemaker/ICD? No Possible current pregnancy? No--hysterectomy Is the patient on methotrexate? No  Current Complaints / other details:  She reports discomfort in abdomen around mass. She states it often flares up in the evenings after a meal, and feels like pressure the radiates out under her breasts and then up/back to her right shoulder. She denies any issues with reflux/indigestion. Patient has received the first 2 Pfizer vaccines  I have personally reviewed her images and discussed her case with Dr. Lorenso Courier. He does not need more tissue to rule out chemotherapy - he is confident this is a low grade process. He wishes for me to consider radiation therapy.   PREVIOUS RADIATION THERAPY: No  PAST MEDICAL HISTORY:  has a past medical history of Anxiety, Bronchitis,  COPD (chronic obstructive pulmonary disease) ([place]), Depression, Hyperlipidemia, and Smoker.    PAST SURGICAL HISTORY: Past Surgical History:  Procedure Laterality Date   ABDOMINAL HYSTERECTOMY     BACK SURGERY     CARPAL TUNNEL RELEASE     rt   CERVICAL DISC SURGERY  13   KNEE ARTHROSCOPY      lft   KNEE ARTHROSCOPY Right 06/10/2020   Procedure: RIGHT KNEE ARTHROSCOPY AND DEBRIDEMENT;  Surgeon: Newt Minion, MD;  Location: [facility];  Service: Orthopedics;  Laterality: Right;   LUMBAR LAMINECTOMY  09/30/2011   Procedure: MICRODISCECTOMY LUMBAR LAMINECTOMY;  Surgeon: Jessy Oto, MD;  Location: [facility];  Service: Orthopedics;  Laterality: Right;  Right L4-5 Microdiscectomy using MIS approach    FAMILY HISTORY: family history is not on file. Father had Hodgkin's lymphoma, Mother had Alzheimer's  SOCIAL HISTORY:  reports that she has been smoking cigarettes. She has been smoking an average of 1 pack per day. She has never used smokeless tobacco. She reports that she does not drink alcohol and does not use drugs.  ALLERGIES: Prednisone, Dilaudid [hydromorphone hcl], Percocet [oxycodone-acetaminophen], Contrast media [iodinated diagnostic agents], and Tape  MEDICATIONS:  Current Outpatient Medications  Medication Sig Dispense Refill   ALPRAZolam (XANAX) 0.5 MG tablet Take 0.5 mg by mouth 2 (two) times daily.     atorvastatin (LIPITOR) 10 MG tablet Take 10 mg by mouth daily.     CALCIUM PO Take 1 tablet by mouth daily.     desipramine (NORPRAMIN) 50 MG tablet Take 50 mg by mouth daily.     ibuprofen (ADVIL) 200 MG tablet Take 200 mg by mouth every 6 (six) hours as needed for headache or moderate pain.     Multiple Vitamins-Minerals (MULTIVITAMINS THER. W/MINERALS) TABS Take 1 tablet by mouth daily.     sertraline (ZOLOFT) 100 MG tablet Take 200 mg by mouth daily.     vitamin C (ASCORBIC ACID) 500 MG tablet Take 1,000 mg by mouth daily.     No current facility-administered medications for this encounter.    REVIEW OF SYSTEMS:  Notable for that above.   PHYSICAL EXAM:  height is '5\' 3"'$  (1.6 m) and weight is 251 lb 6 oz (114 kg). Her temporal temperature is 96.7 F (35.9 C) (abnormal). Her blood pressure is 153/82 (abnormal) and her pulse is 113 (abnormal). Her  respiration is 18 and oxygen saturation is 98%.   General: Alert and oriented, in no acute distress - pleasant affect HEENT: Head is normocephalic. Extraocular movements are intact. Oropharynx is clear. Heart: Regular in rhythm with no murmurs, rubs, or gallops. Borderline tachycardic Chest: Clear to auscultation bilaterally, with no rhonchi, wheezes, or rales. Abdomen: Soft, nontender, nondistended, with no rigidity or guarding. Firm subcutaneous mass, upper abdominal wall, approx 3-4 cm Skin: No concerning lesions. Musculoskeletal: symmetric strength and muscle tone throughout. Neurologic:  No obvious focalities. Speech is fluent. Coordination is intact. Psychiatric: Judgment and insight are intact. Affect is appropriate.   ECOG = 1  0 - Asymptomatic (Fully active, able to carry on all predisease activities without restriction)  1 - Symptomatic but completely ambulatory (Restricted in physically strenuous activity but ambulatory and able to carry out work of a light or sedentary nature. For example, light housework, office work)  2 - Symptomatic, <50% in bed during the day (Ambulatory and capable of all self care but unable to carry out any work activities. Up and about more than 50% of waking hours)  3 - Symptomatic, >50% in bed, but not bedbound (Capable of only limited self-care, confined to bed or chair 50% or more of waking hours)  4 - Bedbound (Completely disabled. Cannot carry on any self-care. Totally confined to bed or chair)  5 - Death   Eustace Pen MM, Creech RH, Tormey DC, et al. [phone]). "Toxicity and response criteria of the [facility]". [place] Oncol. 5 (6): 649-55   LABORATORY DATA:  Lab Results  Component Value Date   WBC 9.1 04/03/2021   HGB 15.1 (H) 04/03/2021   HCT 43.9 04/03/2021   MCV 92.0 04/03/2021   PLT 252 04/03/2021   CMP     Component Value Date/Time   NA 140 04/03/2021 1038   K 4.3 04/03/2021 1038   CL 106 04/03/2021 1038    CO2 24 04/03/2021 1038   GLUCOSE 101 (H) 04/03/2021 1038   BUN 15 04/03/2021 1038   CREATININE 0.94 04/03/2021 1038   CALCIUM 9.6 04/03/2021 1038   PROT 7.3 04/03/2021 1038   ALBUMIN 3.6 04/03/2021 1038   AST 17 04/03/2021 1038   ALT 15 04/03/2021 1038   ALKPHOS 85 04/03/2021 1038   BILITOT 0.4 04/03/2021 1038   GFRNONAA >60 04/03/2021 1038   GFRAA >90 09/24/2011 1042         RADIOGRAPHY: NM PET Image Initial (PI) Skull Base To Thigh  Result Date: 04/25/2021 CLINICAL DATA:  Initial treatment strategy for B-cell lymphoma. EXAM: NUCLEAR MEDICINE PET SKULL BASE TO THIGH TECHNIQUE: 12.4 mCi F-18 FDG was injected intravenously. Full-ring PET imaging was performed from the skull base to thigh after the radiotracer. CT data was obtained and used for attenuation correction and anatomic localization. Fasting blood glucose: 123 mg/dl COMPARISON:  03/15/2021 MRI FINDINGS: Mediastinal blood pool activity: SUV max 3.3 Liver activity: SUV max 4.8 NECK: No significant abnormal hypermetabolic activity in this region. Incidental CT findings: none CHEST: In the subcutaneous tissues of the right chest anterior to the right fifth and sixth rib costal cartilage, a 2.7 by 1.7 cm subcutaneous mass has a maximum SUV of 7.8, [place] 4. Incidental CT findings: Coronary, aortic arch, and branch vessel atherosclerotic vascular disease. Mitral valve calcification. Paraseptal emphysema. Mild scarring or atelectasis in the lingula. ABDOMEN/PELVIS: Physiologic activity in bowel. Incidental CT findings: Cholecystectomy. Aortoiliac atherosclerotic vascular disease. Sigmoid colon diverticulosis. SKELETON: No significant abnormal hypermetabolic activity in this region. Incidental CT findings: Lower cervical plate and screw fixator. Thoracic spondylosis. Lower lumbar degenerative disc disease. IMPRESSION: 1. The known mass in the subcutaneous tissues of the right chest has a maximum SUV of 7.8, [place] 4. No other specific  hypermetabolic lesions are identified. 2. Other imaging findings of potential clinical significance: Aortic Atherosclerosis (ICD10-I70.0) and Emphysema (ICD10-J43.9). Coronary atherosclerosis. Mitral calcification. Sigmoid colon diverticulosis. Electronically Signed   By: Van Clines M.D.   On: 04/25/2021 16:26      IMPRESSION/PLAN:Today, I talked to the patient about the findings and work-up thus far.  We discussed the patient's diagnosis of putative low grade lymphoma and general treatment for this, highlighting the role of radiotherapy in the management.  We discussed the available radiation techniques, and focused on the details of logistics and delivery.     We discussed the risks, benefits, and side effects of radiotherapy. Side effects may include but not necessarily be limited to: skin irritation, fatigue, nausea. Internal organ injury is unlikely given the peripheral location of the mass. No guarantees of treatment were given. A consent form was signed and  placed in the patient's medical record.  I discussed her case with Dr Pascal Lux of IR.  It seems that we may have another nondiagnostic result if he reattempts biopsy, so I will refer to Gen Surgery for a more general biopsy to obtain sufficient tissue to prove diagnosis of lymphoma and discern the type of lymphoma prior to treatment. While I did offer to treat without more tissue, patient and I agree that a definitive diagnosis is preferred up front.  We will then plan Radiation Therapy thereafter, assuming this is a low grade lymphoma.  Anticipate 4 weeks of treatment to the involved site.  The patient was encouraged to ask questions that I answered to the best of my ability.   She is pleased with this plan. Referral to social work for additional support.    On date of service, in total, I spent 60 minutes on this encounter. Patient was seen in person.   __________________________________________   Eppie Gibson, MD  This document  serves as a record of services personally performed by Eppie Gibson, MD. It was created on her behalf by Roney Mans, a trained medical scribe. The creation of this record is based on the scribe's personal observations and the provider's statements to them. This document has been checked and approved by the attending provider.

## 2021-04-28 NOTE — Progress Notes (Signed)
Lymphoma Location(s) / Histology:  Non-Hodgkin Lymphoma, Consistent with [place] presented with symptoms of:  underwent a biopsy of a lesion that developed on her abdomen on 03/24/2021. She reports that the lump in her abdomen developed about ~2 months ago.  She notes that since that time it is not gotten any bigger or changed.  She notes that there is another one that she believes that she feels on her right side but is more difficult to palpate. Patient was then referred to hematology for further evaluation and management  PET Scan 04/24/2021 --IMPRESSION: 1. The known mass in the subcutaneous tissues of the right chest has a maximum SUV of 7.8, [place] 4. No other specific hypermetabolic lesions are identified. 2. Other imaging findings of potential clinical significance: Aortic Atherosclerosis (ICD10-I70.0) and Emphysema (ICD10-J43.9). Coronary atherosclerosis. Mitral calcification. Sigmoid colon diverticulosis.  Biopsies revealed:  03/24/2021 FINAL MICROSCOPIC DIAGNOSIS:  A. SOFT TISSUE MASS, VENTRAL UPPER ABDOMINAL WALL, NEEDLE CORE BIOPSY:  -Atypical lymphoid infiltrate suspicious for non-Hodgkin B-cell lymphoma  -See comment  COMMENT:  The sections show several needle core biopsy fragments of soft tissue displaying a dense atypical lymphoid infiltrate characterized by numerous variably sized and ill-defined lymphoid follicles generally displaying lack of polarity, and a homogeneous composition of small and large lymphocytes.  This is surrounded by predominance of small round to slightly irregular lymphocytes that often encroach on the lymphoid follicles.  The background shows patchy sclerosis.  No fresh tissue is available for flow cytometric analysis and has a battery of immunohistochemical stains was performed including CD3, CD5, CD10, CD20, CD21, CD79a, cyclin D1, BCL6 and BCL-2 with appropriate controls.  The stains show a mixture of T and B cells but with  a significant B-cell component.  The latter is in the form of nodular areas and expansile sheets.  This is associated with significant CD10 positivity in B-cell areas also in the form of nodules, variably sized clusters, and sheets. BCL6 shows similar positivity to CD10.  BCL-2 is diffusely positive although there is no apparent positivity in areas of atypical follicles. No significant cyclin D1 positivity is identified.  CD21 shows variably sized follicular dendritic networks that are occasionally expanded.  The overall morphologic and immunophenotypic features are atypical and worrisome for non-Hodgkin B-cell lymphoma particularly of follicle center cell origin.  Additional tissue is strongly recommended including fresh tissue for flow cytometric analysis to further evaluate this process.   Past/Anticipated interventions by medical oncology, if any:  Under care of Dr. Narda Rutherford 04/27/2021 --Called Tina [name] to discuss the results of her PET CT scan.   --Findings are most consistent with a single FDG avid area in the subcutaneous tissue in the chest.  There is no evidence of greater systemic disease based on the PET CT scan and her lab work.   --Given this I do believe that she would be an excellent candidate for radiation therapy.  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   --We will put in a referral to radiation oncology for consideration of radiation therapy to this lesion.   --Afterwards we will continue to follow the patient to assess for recurrence  Weight changes, if any, over the past 6 months: Reports that she is lost ~20 pounds in the last 1.5 years, and thinks that this may be due to a change in her eating habits. Reports occasional bouts of constipation, but states  she's had regular daily bowel movements as of late. Denies any changes/concerns in urinary habits  Recurrent fevers, or drenching night  sweats, if any: Patient denies  SAFETY ISSUES: Prior radiation? No Pacemaker/ICD? No Possible current pregnancy? No--hysterectomy Is the patient on methotrexate? No  Current Complaints / other details:  She reports discomfort in abdomen around mass. She states it often flares up in the evenings after a meal, and feels like pressure the radiates out under her breasts and then up/back to her right shoulder. She denies any issues with reflux/indigestion. Patient has received the first 2 Pfizer vaccines

## 2021-04-29 ENCOUNTER — Encounter: Payer: Self-pay | Admitting: Radiation Oncology

## 2021-04-29 ENCOUNTER — Other Ambulatory Visit: Payer: Self-pay

## 2021-04-29 ENCOUNTER — Ambulatory Visit
Admission: RE | Admit: 2021-04-29 | Discharge: 2021-04-29 | Disposition: A | Discharge status: Home / Self Care | Payer: Medicare HMO | Source: Ambulatory Visit | Attending: Radiation Oncology | Admitting: Radiation Oncology

## 2021-04-29 VITALS — BP 153/82 | HR 113 | Temp 96.7°F | Resp 18 | Ht 63.0 in | Wt 251.4 lb

## 2021-04-29 DIAGNOSIS — E785 Hyperlipidemia, unspecified: Secondary | ICD-10-CM | POA: Diagnosis not present

## 2021-04-29 DIAGNOSIS — K5900 Constipation, unspecified: Secondary | ICD-10-CM | POA: Diagnosis not present

## 2021-04-29 DIAGNOSIS — J439 Emphysema, unspecified: Secondary | ICD-10-CM | POA: Insufficient documentation

## 2021-04-29 DIAGNOSIS — M5136 Other intervertebral disc degeneration, lumbar region: Secondary | ICD-10-CM | POA: Diagnosis not present

## 2021-04-29 DIAGNOSIS — C8293 Follicular lymphoma, unspecified, intra-abdominal lymph nodes: Secondary | ICD-10-CM | POA: Diagnosis not present

## 2021-04-29 DIAGNOSIS — F17210 Nicotine dependence, cigarettes, uncomplicated: Secondary | ICD-10-CM | POA: Insufficient documentation

## 2021-04-29 DIAGNOSIS — Z79899 Other long term (current) drug therapy: Secondary | ICD-10-CM | POA: Diagnosis not present

## 2021-04-29 DIAGNOSIS — I2510 Atherosclerotic heart disease of native coronary artery without angina pectoris: Secondary | ICD-10-CM | POA: Insufficient documentation

## 2021-04-29 DIAGNOSIS — J449 Chronic obstructive pulmonary disease, unspecified: Secondary | ICD-10-CM | POA: Diagnosis not present

## 2021-04-29 DIAGNOSIS — K5730 Diverticulosis of large intestine without perforation or abscess without bleeding: Secondary | ICD-10-CM | POA: Insufficient documentation

## 2021-04-29 DIAGNOSIS — C8299 Follicular lymphoma, unspecified, extranodal and solid organ sites: Secondary | ICD-10-CM

## 2021-04-29 DIAGNOSIS — C8510 Unspecified B-cell lymphoma, unspecified site: Secondary | ICD-10-CM

## 2021-04-30 ENCOUNTER — Telehealth: Payer: Self-pay | Admitting: *Deleted

## 2021-04-30 ENCOUNTER — Encounter: Payer: Self-pay | Admitting: General Practice

## 2021-04-30 NOTE — Telephone Encounter (Signed)
CALLED PATIENT TO INFORM OF NUTRITION APPT. ON 05-05-21 @ 2:30 PM WITH BARBARA NEFF, SPOKE WITH PATIENT AND SHE IS AWARE OF THIS APPT.

## 2021-04-30 NOTE — Progress Notes (Signed)
[place] Psychosocial Distress Screening Clinical Social Work  Clinical Social Work was referred by distress screening protocol.  The patient scored a 10 on the Psychosocial Distress Thermometer which indicates severe distress. Clinical Social Worker contacted patient by phone to assess for distress and other psychosocial needs. She feels less anxious after speaking with radiation oncologist.  Is on medication for anxiety prior to cancer diagnosis.  Sees Launa Flight MD as psychiatrist.  Discussed resources available through [facility] for classes and resources (yoga, tai chi, massage, support groups, classes and similar) for managing anxiety during treatment.  CSW and patient discussed common feeling and emotions when being diagnosed with cancer, and the importance of support during treatment.  CSW informed patient of the support team and support services at [facility].  CSW provided contact information and encouraged patient to call with any questions or concerns.   ONCBCN DISTRESS SCREENING 04/29/2021  Screening Type Initial Screening  Distress experienced in past week (1-10) 10  Emotional problem type Depression;Nervousness/Anxiety;Adjusting to illness  Spiritual/Religous concerns type Relating to God  Information Concerns Type Lack of info about diagnosis;Lack of info about treatment;Lack of info about complementary therapy choices;Lack of info about maintaining fitness  Physical Problem type Pain;Sleep/insomnia  Physician notified of physical symptoms Yes  Referral to clinical psychology No  Referral to clinical social work Yes  Referral to dietition Yes  Referral to financial advocate No  Referral to support programs Yes  Referral to palliative care No    Clinical Social Worker follow up needed: No.  If yes, follow up plan:  Beverely Pace, [place], LCSW Clinical Social Worker Phone:  [phone]

## 2021-05-01 ENCOUNTER — Telehealth: Payer: Self-pay | Admitting: *Deleted

## 2021-05-01 NOTE — Telephone Encounter (Signed)
PATIENT TO SEE DR. Zenia Resides ([place]) ON 05-04-21 @ 4 PM

## 2021-05-02 ENCOUNTER — Encounter: Payer: Self-pay | Admitting: Radiation Oncology

## 2021-05-02 DIAGNOSIS — C8510 Unspecified B-cell lymphoma, unspecified site: Secondary | ICD-10-CM | POA: Insufficient documentation

## 2021-05-04 ENCOUNTER — Ambulatory Visit: Payer: Self-pay | Admitting: Surgery

## 2021-05-04 DIAGNOSIS — C8590 Non-Hodgkin lymphoma, unspecified, unspecified site: Secondary | ICD-10-CM | POA: Diagnosis not present

## 2021-05-04 DIAGNOSIS — R222 Localized swelling, mass and lump, trunk: Secondary | ICD-10-CM | POA: Diagnosis not present

## 2021-05-04 NOTE — H&P (Signed)
History of Present Illness: Tina [name] is a 70 y.o. female who is seen today as an office consultation at the request of Dr. Isidore Moos for biopsy to confirm a diagnosis of lymphoma. The patient developed a mass on the lower chest/upper abdominal wall. She first noticed it about 2 months ago. It has not changed in size and is not painful. MRI was concerning for a neoplastic process. 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 She underwent a PET on 7/22 which showed FDG uptake of the mass but no other hypermetabolic lesions. She has already seen Dr. Lorenso Courier and Dr. Isidore Moos, with tentative plans to begin radiation and chemotherapy once a definitive diagnosis is obtained. She has been referred to me for a biopsy of the abdominal wall mass to obtain more tissue.     Review of Systems: A complete review of systems was obtained from the patient.  I have reviewed this information and discussed as appropriate with the patient.  See HPI as well for other ROS.     Medical History: Past Medical History Past Medical History: Diagnosis Date  Anxiety    History of cancer    Hyperlipidemia        There is no problem list on file for this patient.     Past Surgical History Past Surgical History: Procedure Laterality Date  2 lower back surgery  N/A    gallbladder surgery N/A    HYSTERECTOMY      knee surgery  N/A    neck surgery N/A        Allergies Allergies Allergen Reactions  Adhesive Itching     Tape on iv  Hydromorphone Hives and Vomiting  Oxycodone-Acetaminophen Hives and Vomiting  Prednisone Shortness Of Breath     "Made me feel crazy"  Iodinated Contrast Media Nausea And Vomiting  Adhesive Tape-Silicones Itching     Tape on iv      Current Outpatient Medications on File Prior to Visit Medication Sig Dispense Refill  ALPRAZolam (XANAX) 0.5 MG tablet TAKE 1 TABLET BY MOUTH FOUR TIMES DAILY AS  NEEDED FOR ANXIETY OR PANIC      ascorbic acid, vitamin C, (VITAMIN C) 500 MG tablet Take by mouth      atorvastatin (LIPITOR) 10 MG tablet atorvastatin 10 mg tablet  TAKE 1 TABLET BY MOUTH EVERY DAY      azelastine (ASTELIN) 137 mcg nasal spray azelastine 137 mcg (0.1 %) nasal spray aerosol  instill 2 sprays into each nostril twice a day      benzonatate (TESSALON) 100 MG capsule benzonatate 100 mg capsule  take 1 to 2 capsules by mouth three times a day if needed for cough      clonazePAM (KLONOPIN) 0.5 MG tablet clonazepam 0.5 mg tablet  take 1/2 to 1 tablet by mouth at bedtime if needed for SPASMS      desipramine (NORPRAMIN) 50 MG tablet desipramine 50 mg tablet  TAKE 1 TABLET BY MOUTH ONCE DAILY      diclofenac (VOLTAREN) 75 MG EC tablet diclofenac sodium 75 mg tablet,delayed release  take 1 tablet by mouth twice a day with food if needed for pain      fexofenadine (ALLEGRA) 180 MG tablet Allergy Relief (fexofenadine) 180 mg tablet  take 1 tablet by mouth once daily      fluconazole (DIFLUCAN) 100 MG tablet fluconazole 100 mg tablet  take 1 tablet by mouth  as directed      HYDROcodone-acetaminophen (NORCO) 5-325 mg tablet TAKE 1 TABLET BY MOUTH EVERY 4 HOURS AS NEEDED FOR MODERATE PAIN      ibuprofen (MOTRIN) 200 MG tablet Take by mouth      ipratropium (ATROVENT) 0.06 % nasal spray ipratropium bromide 42 mcg (0.06 %) nasal spray  SPRAY 2 SPRAYS INTO EACH NOSTRIL 3 TIMES A DAY      ketoconazole (NIZORAL) 2 % cream ketoconazole 2 % topical cream  APP EXT AA BID      ketotifen (ZADITOR) 0.025 % (0.035 %) ophthalmic solution Eye Itch Relief 0.025 % (0.035 %) drops  place 1 drop into both eyes twice a day if needed      levoFLOXacin (LEVAQUIN) 500 MG tablet levofloxacin 500 mg tablet  take 1 tablet by mouth once daily for 21 DAYS      meloxicam (MOBIC) 15 MG tablet meloxicam 15 mg tablet  take 1 tablet by mouth once daily      sertraline (ZOLOFT) 100 MG tablet sertraline 100 mg  tablet  TK 2 TS PO QD       No current facility-administered medications on file prior to visit.     Family History Family History Problem Relation Age of Onset  High blood pressure (Hypertension) Mother    Hyperlipidemia (Elevated cholesterol) Mother    High blood pressure (Hypertension) Father    Hyperlipidemia (Elevated cholesterol) Father        Social History   Tobacco Use Smoking Status Never Smoker Smokeless Tobacco Not on file     Social History Social History    Socioeconomic History  Marital status: Married Tobacco Use  Smoking status: Never Smoker Vaping Use  Vaping Use: Never used Substance and Sexual Activity  Alcohol use: Never  Drug use: Never      Objective:     Vitals:   05/04/21 1555 BP: (!) 142/80 Pulse: 108 Temp: 36.7 C (98.1 F) SpO2: 98% Weight: (!) 114.8 kg (253 lb) Height: 160 cm ('5\' 3"'$ )   Body mass index is 44.82 kg/m.   Physical Exam Vitals reviewed.  Constitutional:      General: She is not in acute distress.    Appearance: Normal appearance.  HENT:     Head: Normocephalic and atraumatic.  Eyes:     General: No scleral icterus.    Conjunctiva/sclera: Conjunctivae normal.  Cardiovascular:     Rate and Rhythm: Normal rate and regular rhythm.     Heart sounds: No murmur heard. Pulmonary:     Effort: Pulmonary effort is normal. No respiratory distress.     Breath sounds: Normal breath sounds.  Abdominal:     General: There is no distension.     Palpations: Abdomen is soft.     Tenderness: There is no abdominal tenderness.     Comments: Subcutaneous mass, approximately 2cm in diameter, on the upper abdominal wall just to the right of midline.  Musculoskeletal:        General: Normal range of motion.     Cervical back: Normal range of motion.  Skin:    General: Skin is warm and dry.     Coloration: Skin is not jaundiced.  Neurological:     General: No focal deficit present.     Mental Status: She is alert and  oriented to person, place, and time.  Psychiatric:        Mood and Affect: Mood normal.        Behavior: Behavior normal.  Thought Content: Thought content normal.          Labs, Imaging and Diagnostic Testing: PET/CT 7/22: IMPRESSION: 1. The known mass in the subcutaneous tissues of the right chest has a maximum SUV of 7.8, Deauville 4. No other specific hypermetabolic lesions are identified. 2. Other imaging findings of potential clinical significance: Aortic Atherosclerosis (ICD10-I70.0) and Emphysema (ICD10-J43.9). Coronary atherosclerosis. Mitral calcification. Sigmoid colon diverticulosis.     Assessment and Plan: Diagnoses and all orders for this visit:   Lymphoma, unspecified body region, unspecified lymphoma type (CMS-HCC)     70 yo female with suspected lymphoma, with nondiagnostic percutaneous biopsy. Will proceed with excisional biopsy of the hypermetabolic abdominal wall mass. I discussed the procedure details with the patient and she agrees to proceed. This will be scheduled this Wednesday 8/2. She is to undergo an echo tomorrow as part of her workup prior to beginning chemotherapy. Preop instructions discussed with the patient.  Michaelle Birks, [facility], Hepatobiliary and Pancreatic Surgery 05/04/21 4:47 PM

## 2021-05-04 NOTE — H&P (View-Only) (Signed)
History of Present Illness: Tina [name] is a 70 y.o. female who is seen today as an office consultation at the request of Dr. Isidore Moos for biopsy to confirm a diagnosis of lymphoma. The patient developed a mass on the lower chest/upper abdominal wall. She first noticed it about 2 months ago. It has not changed in size and is not painful. MRI was concerning for a neoplastic process. 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 She underwent a PET on 7/22 which showed FDG uptake of the mass but no other hypermetabolic lesions. She has already seen Dr. Lorenso Courier and Dr. Isidore Moos, with tentative plans to begin radiation and chemotherapy once a definitive diagnosis is obtained. She has been referred to me for a biopsy of the abdominal wall mass to obtain more tissue.     Review of Systems: A complete review of systems was obtained from the patient.  I have reviewed this information and discussed as appropriate with the patient.  See HPI as well for other ROS.     Medical History: Past Medical History Past Medical History: Diagnosis Date  Anxiety    History of cancer    Hyperlipidemia        There is no problem list on file for this patient.     Past Surgical History Past Surgical History: Procedure Laterality Date  2 lower back surgery  N/A    gallbladder surgery N/A    HYSTERECTOMY      knee surgery  N/A    neck surgery N/A        Allergies Allergies Allergen Reactions  Adhesive Itching     Tape on iv  Hydromorphone Hives and Vomiting  Oxycodone-Acetaminophen Hives and Vomiting  Prednisone Shortness Of Breath     "Made me feel crazy"  Iodinated Contrast Media Nausea And Vomiting  Adhesive Tape-Silicones Itching     Tape on iv      Current Outpatient Medications on File Prior to Visit Medication Sig Dispense Refill  ALPRAZolam (XANAX) 0.5 MG tablet TAKE 1 TABLET BY MOUTH FOUR TIMES DAILY AS  NEEDED FOR ANXIETY OR PANIC      ascorbic acid, vitamin C, (VITAMIN C) 500 MG tablet Take by mouth      atorvastatin (LIPITOR) 10 MG tablet atorvastatin 10 mg tablet  TAKE 1 TABLET BY MOUTH EVERY DAY      azelastine (ASTELIN) 137 mcg nasal spray azelastine 137 mcg (0.1 %) nasal spray aerosol  instill 2 sprays into each nostril twice a day      benzonatate (TESSALON) 100 MG capsule benzonatate 100 mg capsule  take 1 to 2 capsules by mouth three times a day if needed for cough      clonazePAM (KLONOPIN) 0.5 MG tablet clonazepam 0.5 mg tablet  take 1/2 to 1 tablet by mouth at bedtime if needed for SPASMS      desipramine (NORPRAMIN) 50 MG tablet desipramine 50 mg tablet  TAKE 1 TABLET BY MOUTH ONCE DAILY      diclofenac (VOLTAREN) 75 MG EC tablet diclofenac sodium 75 mg tablet,delayed release  take 1 tablet by mouth twice a day with food if needed for pain      fexofenadine (ALLEGRA) 180 MG tablet Allergy Relief (fexofenadine) 180 mg tablet  take 1 tablet by mouth once daily      fluconazole (DIFLUCAN) 100 MG tablet fluconazole 100 mg tablet  take 1 tablet by mouth  as directed      HYDROcodone-acetaminophen (NORCO) 5-325 mg tablet TAKE 1 TABLET BY MOUTH EVERY 4 HOURS AS NEEDED FOR MODERATE PAIN      ibuprofen (MOTRIN) 200 MG tablet Take by mouth      ipratropium (ATROVENT) 0.06 % nasal spray ipratropium bromide 42 mcg (0.06 %) nasal spray  SPRAY 2 SPRAYS INTO EACH NOSTRIL 3 TIMES A DAY      ketoconazole (NIZORAL) 2 % cream ketoconazole 2 % topical cream  APP EXT AA BID      ketotifen (ZADITOR) 0.025 % (0.035 %) ophthalmic solution Eye Itch Relief 0.025 % (0.035 %) drops  place 1 drop into both eyes twice a day if needed      levoFLOXacin (LEVAQUIN) 500 MG tablet levofloxacin 500 mg tablet  take 1 tablet by mouth once daily for 21 DAYS      meloxicam (MOBIC) 15 MG tablet meloxicam 15 mg tablet  take 1 tablet by mouth once daily      sertraline (ZOLOFT) 100 MG tablet sertraline 100 mg  tablet  TK 2 TS PO QD       No current facility-administered medications on file prior to visit.     Family History Family History Problem Relation Age of Onset  High blood pressure (Hypertension) Mother    Hyperlipidemia (Elevated cholesterol) Mother    High blood pressure (Hypertension) Father    Hyperlipidemia (Elevated cholesterol) Father        Social History   Tobacco Use Smoking Status Never Smoker Smokeless Tobacco Not on file     Social History Social History    Socioeconomic History  Marital status: Married Tobacco Use  Smoking status: Never Smoker Vaping Use  Vaping Use: Never used Substance and Sexual Activity  Alcohol use: Never  Drug use: Never      Objective:     Vitals:   05/04/21 1555 BP: (!) 142/80 Pulse: 108 Temp: 36.7 C (98.1 F) SpO2: 98% Weight: (!) 114.8 kg (253 lb) Height: 160 cm ('5\' 3"'$ )   Body mass index is 44.82 kg/m.   Physical Exam Vitals reviewed.  Constitutional:      General: She is not in acute distress.    Appearance: Normal appearance.  HENT:     Head: Normocephalic and atraumatic.  Eyes:     General: No scleral icterus.    Conjunctiva/sclera: Conjunctivae normal.  Cardiovascular:     Rate and Rhythm: Normal rate and regular rhythm.     Heart sounds: No murmur heard. Pulmonary:     Effort: Pulmonary effort is normal. No respiratory distress.     Breath sounds: Normal breath sounds.  Abdominal:     General: There is no distension.     Palpations: Abdomen is soft.     Tenderness: There is no abdominal tenderness.     Comments: Subcutaneous mass, approximately 2cm in diameter, on the upper abdominal wall just to the right of midline.  Musculoskeletal:        General: Normal range of motion.     Cervical back: Normal range of motion.  Skin:    General: Skin is warm and dry.     Coloration: Skin is not jaundiced.  Neurological:     General: No focal deficit present.     Mental Status: She is alert and  oriented to person, place, and time.  Psychiatric:        Mood and Affect: Mood normal.        Behavior: Behavior normal.  Thought Content: Thought content normal.          Labs, Imaging and Diagnostic Testing: PET/CT 7/22: IMPRESSION: 1. The known mass in the subcutaneous tissues of the right chest has a maximum SUV of 7.8, [place]au[place] 4. No other specific hypermetabolic lesions are identified. 2. Other imaging findings of potential clinical significance: Aortic Atherosclerosis (ICD10-I70.0) and Emphysema (ICD10-J43.9). Coronary atherosclerosis. Mitral calcification. Sigmoid colon diverticulosis.     Assessment and Plan: Diagnoses and all orders for this visit:   Lymphoma, unspecified body region, unspecified lymphoma type (CMS-HCC)     70 yo female with suspected lymphoma, with nondiagnostic percutaneous biopsy. Will proceed with excisional biopsy of the hypermetabolic abdominal wall mass. I discussed the procedure details with the patient and she agrees to proceed. This will be scheduled this Wednesday 8/2. She is to undergo an echo tomorrow as part of her workup prior to beginning chemotherapy. Preop instructions discussed with the patient.  Michaelle Birks, [facility], Hepatobiliary and Pancreatic Surgery 05/04/21 4:47 PM

## 2021-05-05 ENCOUNTER — Telehealth: Payer: Self-pay | Admitting: Nutrition

## 2021-05-05 ENCOUNTER — Ambulatory Visit (HOSPITAL_COMMUNITY)
Admission: RE | Admit: 2021-05-05 | Discharge: 2021-05-05 | Disposition: A | Discharge status: Home / Self Care | Payer: Medicare HMO | Source: Ambulatory Visit | Attending: Hematology and Oncology | Admitting: Hematology and Oncology

## 2021-05-05 ENCOUNTER — Inpatient Hospital Stay: Payer: Medicare HMO | Attending: Hematology and Oncology | Admitting: Nutrition

## 2021-05-05 ENCOUNTER — Other Ambulatory Visit: Payer: Self-pay

## 2021-05-05 DIAGNOSIS — I517 Cardiomegaly: Secondary | ICD-10-CM | POA: Diagnosis not present

## 2021-05-05 DIAGNOSIS — E785 Hyperlipidemia, unspecified: Secondary | ICD-10-CM | POA: Diagnosis not present

## 2021-05-05 DIAGNOSIS — J449 Chronic obstructive pulmonary disease, unspecified: Secondary | ICD-10-CM | POA: Diagnosis not present

## 2021-05-05 DIAGNOSIS — Z0189 Encounter for other specified special examinations: Secondary | ICD-10-CM | POA: Diagnosis not present

## 2021-05-05 DIAGNOSIS — C8290 Follicular lymphoma, unspecified, unspecified site: Secondary | ICD-10-CM | POA: Insufficient documentation

## 2021-05-05 DIAGNOSIS — I080 Rheumatic disorders of both mitral and aortic valves: Secondary | ICD-10-CM | POA: Diagnosis not present

## 2021-05-05 DIAGNOSIS — Z01818 Encounter for other preprocedural examination: Secondary | ICD-10-CM | POA: Diagnosis not present

## 2021-05-05 LAB — ECHOCARDIOGRAM COMPLETE
AR max vel: 3.22 cm2
AV Area VTI: 2.94 cm2
AV Area mean vel: 2.94 cm2
AV Mean grad: 13 mmHg
AV Peak grad: 23.4 mmHg
Ao pk vel: 2.42 m/s
Area-P 1/2: 4.41 cm2
Calc EF: 62.3 %
S' Lateral: 2.7 cm
Single Plane A2C EF: 65.1 %
Single Plane A4C EF: 60.7 %

## 2021-05-05 NOTE — Progress Notes (Signed)
See telephone note.

## 2021-05-05 NOTE — Progress Notes (Signed)
*  PRELIMINARY RESULTS* Echocardiogram 2D Echocardiogram has been performed.  Luisa Hart RDCS 05/05/2021, 8:40 AM

## 2021-05-05 NOTE — Telephone Encounter (Signed)
Patient is a 70 year old female diagnosed with lymphoma followed by Dr. Lorenso Courier.  She is scheduled for biopsy.  Past medical history includes COPD, hyperlipidemia, depression, and anxiety.  Medications include Xanax, multivitamin, Zoloft, and vitamin C.  Labs were reviewed.  Height: 5 feet 3 inches. Weight: 251.38 pounds. Usual body weight: 270 pounds 1 year ago. BMI: 24.53.  Patient denies nutrition impact symptoms. Reports weight loss due to retirement and different eating habits. Patient has lost 7% body weight over 1 year which is not significant.  No nutrition diagnosis.  Provided general healthy eating tips to help patient heal after surgery.  Encourage patient to contact RD if she develops questions or concerns with upcoming treatments.

## 2021-05-06 ENCOUNTER — Other Ambulatory Visit: Payer: Self-pay

## 2021-05-06 ENCOUNTER — Encounter (HOSPITAL_COMMUNITY): Payer: Self-pay | Admitting: Surgery

## 2021-05-06 ENCOUNTER — Other Ambulatory Visit: Payer: Self-pay | Admitting: Surgery

## 2021-05-06 NOTE — Progress Notes (Addendum)
PCP - Reginia Naas, MD Cardiologist - denies  PPM/ICD - denies Device Orders - N/A Rep Notified - N/A  Chest x-ray - N/A EKG - on DOS - 05/07/2021 Stress Test - denies ECHO - 05/05/2021 Cardiac Cath - denies  Sleep Study - denies CPAP - N/A  Fasting Blood Sugar - N/A  Blood Thinner Instructions: N/A Aspirin Instructions: patient was instructed to stop taking any Aspirin containing products, vitamins, herbal medication until after surgery.   ERAS Protcol - N/A  COVID TEST- Patient was instructed to go for test at [facility] testing site today, until 3 PM   Patient was instructed to arrive at 05:30 o'clock on 05/07/2021. Patient was instructed to not eat or drink anything after midnight; patient can have with a small sip of water the morning of surgery - Xanax, Lipitor, Zoloft, Desipramine.   Anesthesia review: asked PA-C for chart review  Patient denies shortness of breath, fever, cough and chest pain at PAT appointment   All instructions explained to the patient, with a verbal understanding of the material. Patient agrees to go over the instructions while at home for a better understanding. The opportunity to ask questions was provided.

## 2021-05-06 NOTE — Anesthesia Preprocedure Evaluation (Addendum)
Anesthesia Evaluation  Patient identified by MRN, date of birth, ID band Patient awake    Reviewed: Allergy & Precautions, NPO status , Patient's Chart, lab work & pertinent test results  Airway Mallampati: II  TM Distance: >3 FB Neck ROM: Full    Dental  (+) Teeth Intact, Dental Advisory Given   Pulmonary COPD, Current Smoker and Patient abstained from smoking.,    breath sounds clear to auscultation       Cardiovascular negative cardio ROS   Rhythm:Regular Rate:Normal     Neuro/Psych PSYCHIATRIC DISORDERS Anxiety Depression  Neuromuscular disease    GI/Hepatic negative GI ROS, Neg liver ROS,   Endo/Other  negative endocrine ROS  Renal/GU negative Renal ROS     Musculoskeletal negative musculoskeletal ROS (+)   Abdominal (+) + obese,   Peds  Hematology negative hematology ROS (+)   Anesthesia Other Findings   Reproductive/Obstetrics                           Anesthesia Physical Anesthesia Plan  ASA: 3  Anesthesia Plan: General   Post-op Pain Management:    Induction: Intravenous  PONV Risk Score and Plan: 3 and Dexamethasone, Midazolam and Ondansetron  Airway Management Planned: Oral ETT  Additional Equipment: None  Intra-op Plan:   Post-operative Plan: Extubation in OR  Informed Consent: I have reviewed the patients History and Physical, chart, labs and discussed the procedure including the risks, benefits and alternatives for the proposed anesthesia with the patient or authorized representative who has indicated his/her understanding and acceptance.     Dental advisory given  Plan Discussed with: CRNA  Anesthesia Plan Comments: (PAT note written 05/06/2021 by Myra Gianotti, PA-C. SAME DAY WORK-UP. For EKG day of surgery.  Echo:  1. Left ventricular ejection fraction, by estimation, is >75%. The left  ventricle has hyperdynamic function. The left ventricle has no  regional  wall motion abnormalities. There is mild concentric left ventricular  hypertrophy. Left ventricular diastolic  parameters are consistent with Grade I diastolic dysfunction (impaired  relaxation). Elevated left atrial pressure.  2. Right ventricular systolic function is normal. The right ventricular  size is normal. Tricuspid regurgitation signal is inadequate for assessing  PA pressure.  3. The mitral valve is normal in structure. No evidence of mitral valve  regurgitation. No evidence of mitral stenosis.  4. The aortic valve is normal in structure. There is mild calcification  of the aortic valve. Aortic valve regurgitation is not visualized. Mild  aortic valve sclerosis is present, with no evidence of aortic valve  stenosis.  5. The inferior vena cava is normal in size with greater than 50%  respiratory variability, suggesting right atrial pressure of 3 mmHg. )      Anesthesia Quick Evaluation

## 2021-05-06 NOTE — Progress Notes (Signed)
Anesthesia Chart Review: Tina [name]   Case: [id number] Date/Time: 05/07/21 0713   Procedure: EXCISIONAL BIOPSY ABDOMINAL WALL MASS   Anesthesia type: Choice   Pre-op diagnosis: ABDOMINAL WALL MASS   Location: [facility] OR ROOM 02 / [place] [facility]   Surgeons: Dwan Bolt, MD       DISCUSSION: Patient is a 70 year old female scheduled for the above procedure. 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  She was referred to general surgery for surgical biopsy to confirm diagnosis.  If positive, radiation and chemotherapy is anticipated.  Other history includes smoking, COPD/bronchitis, anxiety, depression, HLD, back surgery (right L4-5 laminectomy 09/30/11), neck surgery (C5-6 ACDF 06/25/11), right knee arthroscopy (06/10/20).  Preoperative COVID-19 test is scheduled for 05/06/2021.  She is a same-day work-up, so anesthesia team to evaluate on the day of surgery. Given her last couple of heart rates were tachycardic (110-113 bpm), will plan for preoperative EKG. Recent pre-chemo echo showed EF > 75%, mild LVH, grade 1 DD, no LV regional wall motion abnormalities.   VS:  BP Readings from Last 3 Encounters:  04/29/21 (!) 153/82  04/03/21 (!) 156/64  03/24/21 (!) 143/77   Pulse Readings from Last 3 Encounters:  04/29/21 (!) 113  04/03/21 (!) 110  03/24/21 88     PROVIDERS: Carol Ada, MD is PCP  Sheran Spine, MD is HEM-ONC Eppie Gibson, MD is RAD-ONC   LABS: Lab results as of 04/03/21 include: Lab Results  Component Value Date   WBC 9.1 04/03/2021   HGB 15.1 (H) 04/03/2021   HCT 43.9 04/03/2021   PLT 252 04/03/2021   GLUCOSE 101 (H) 04/03/2021   ALT 15 04/03/2021   AST 17 04/03/2021   NA 140 04/03/2021   K 4.3 04/03/2021   CL 106 04/03/2021   CREATININE 0.94 04/03/2021   BUN 15 04/03/2021   CO2 24 04/03/2021   INR 1.0 03/24/2021    IMAGES: PET Scan 04/24/21: IMPRESSION: 1. The known mass in the  subcutaneous tissues of the right chest has a maximum SUV of 7.8, [place] 4. No other specific hypermetabolic lesions are identified. 2. Other imaging findings of potential clinical significance: Aortic Atherosclerosis (ICD10-I70.0) and Emphysema (ICD10-J43.9). Coronary atherosclerosis. Mitral calcification. Sigmoid colon diverticulosis.   MRI Chest 03/15/21: IMPRESSION: Subcutaneous, T2 hyperintense/T1 hypointense mass with angulated margins, measuring 4.3 x 2.6 x 1.7 cm, corresponding to the palpable area of concern in the anterior chest. Imaging characteristics are nonspecific on this noncontrast exam, but concerning for neoplasm. Recommend ultrasound-guided biopsy for definitive tissue characterization.   EKG: Last EKG seen was on 06/23/11 and showed ST at 104 bpm.   CV: Echo 05/05/21 (pre-chemo): IMPRESSIONS   1. Left ventricular ejection fraction, by estimation, is >75%. The left  ventricle has hyperdynamic function. The left ventricle has no regional  wall motion abnormalities. There is mild concentric left ventricular  hypertrophy. Left ventricular diastolic  parameters are consistent with Grade I diastolic dysfunction (impaired  relaxation). Elevated left atrial pressure.   2. Right ventricular systolic function is normal. The right ventricular  size is normal. Tricuspid regurgitation signal is inadequate for assessing  PA pressure.   3. The mitral valve is normal in structure. No evidence of mitral valve  regurgitation. No evidence of mitral stenosis.   4. The aortic valve is normal in structure. There is mild calcification  of the aortic valve. Aortic valve regurgitation is not visualized. Mild  aortic valve sclerosis is present,  with no evidence of aortic valve  stenosis.   5. The inferior vena cava is normal in size with greater than 50%  respiratory variability, suggesting right atrial pressure of 3 mmHg.    Past Medical History:  Diagnosis Date   Anxiety     Bronchitis    hx   COPD (chronic obstructive pulmonary disease) ([place])    smoker 1ppp   Depression    Hyperlipidemia    Smoker    1ppd    Past Surgical History:  Procedure Laterality Date   ABDOMINAL HYSTERECTOMY     BACK SURGERY     CARPAL TUNNEL RELEASE     rt   CERVICAL DISC SURGERY  10/04/2010   CHOLECYSTECTOMY     KNEE ARTHROSCOPY     lft   KNEE ARTHROSCOPY Right 06/10/2020   Procedure: RIGHT KNEE ARTHROSCOPY AND DEBRIDEMENT;  Surgeon: Newt Minion, MD;  Location: [facility];  Service: Orthopedics;  Laterality: Right;   LUMBAR LAMINECTOMY  09/30/2011   Procedure: MICRODISCECTOMY LUMBAR LAMINECTOMY;  Surgeon: Jessy Oto, MD;  Location: [facility] [facility];  Service: Orthopedics;  Laterality: Right;  Right L4-5 Microdiscectomy using MIS approach    MEDICATIONS: No current facility-administered medications for this encounter.    ALPRAZolam (XANAX) 0.5 MG tablet   atorvastatin (LIPITOR) 10 MG tablet   CALCIUM PO   desipramine (NORPRAMIN) 50 MG tablet   ibuprofen (ADVIL) 200 MG tablet   Multiple Vitamins-Minerals (MULTIVITAMINS THER. W/MINERALS) TABS   sertraline (ZOLOFT) 100 MG tablet   vitamin C (ASCORBIC ACID) 500 MG tablet    Myra Gianotti, PA-C Surgical Short Stay/Anesthesiology [facility] Phone [phone] [facility] Phone [phone] 05/06/2021 2:04 PM

## 2021-05-07 ENCOUNTER — Encounter (HOSPITAL_COMMUNITY)
Admission: RE | Disposition: A | Discharge status: Home / Self Care | Payer: Self-pay | Source: Home / Self Care | Attending: Surgery

## 2021-05-07 ENCOUNTER — Ambulatory Visit (HOSPITAL_COMMUNITY)
Admission: RE | Admit: 2021-05-07 | Discharge: 2021-05-07 | Disposition: A | Discharge status: Home / Self Care | Payer: Medicare HMO | Attending: Surgery | Admitting: Surgery

## 2021-05-07 ENCOUNTER — Ambulatory Visit (HOSPITAL_COMMUNITY): Payer: Medicare HMO | Admitting: Vascular Surgery

## 2021-05-07 ENCOUNTER — Encounter (HOSPITAL_COMMUNITY): Payer: Self-pay | Admitting: Surgery

## 2021-05-07 DIAGNOSIS — Z91041 Radiographic dye allergy status: Secondary | ICD-10-CM | POA: Diagnosis not present

## 2021-05-07 DIAGNOSIS — F17210 Nicotine dependence, cigarettes, uncomplicated: Secondary | ICD-10-CM | POA: Insufficient documentation

## 2021-05-07 DIAGNOSIS — C8599 Non-Hodgkin lymphoma, unspecified, extranodal and solid organ sites: Secondary | ICD-10-CM | POA: Diagnosis present

## 2021-05-07 DIAGNOSIS — F418 Other specified anxiety disorders: Secondary | ICD-10-CM | POA: Diagnosis not present

## 2021-05-07 DIAGNOSIS — R1900 Intra-abdominal and pelvic swelling, mass and lump, unspecified site: Secondary | ICD-10-CM | POA: Diagnosis not present

## 2021-05-07 DIAGNOSIS — Z91048 Other nonmedicinal substance allergy status: Secondary | ICD-10-CM | POA: Insufficient documentation

## 2021-05-07 DIAGNOSIS — Z888 Allergy status to other drugs, medicaments and biological substances status: Secondary | ICD-10-CM | POA: Diagnosis not present

## 2021-05-07 DIAGNOSIS — Z791 Long term (current) use of non-steroidal anti-inflammatories (NSAID): Secondary | ICD-10-CM | POA: Insufficient documentation

## 2021-05-07 DIAGNOSIS — E785 Hyperlipidemia, unspecified: Secondary | ICD-10-CM | POA: Diagnosis not present

## 2021-05-07 DIAGNOSIS — J449 Chronic obstructive pulmonary disease, unspecified: Secondary | ICD-10-CM | POA: Diagnosis not present

## 2021-05-07 DIAGNOSIS — Z885 Allergy status to narcotic agent status: Secondary | ICD-10-CM | POA: Diagnosis not present

## 2021-05-07 DIAGNOSIS — C8519 Unspecified B-cell lymphoma, extranodal and solid organ sites: Secondary | ICD-10-CM | POA: Diagnosis not present

## 2021-05-07 DIAGNOSIS — C8513 Unspecified B-cell lymphoma, intra-abdominal lymph nodes: Secondary | ICD-10-CM | POA: Diagnosis not present

## 2021-05-07 DIAGNOSIS — Z79899 Other long term (current) drug therapy: Secondary | ICD-10-CM | POA: Insufficient documentation

## 2021-05-07 HISTORY — PX: BIOPSY OF SKIN SUBCUTANEOUS TISSUE AND/OR MUCOUS MEMBRANE: SHX6741

## 2021-05-07 LAB — CBC
HCT: 39.3 % (ref 36.0–46.0)
Hemoglobin: 13.6 g/dL (ref 12.0–15.0)
MCH: 32.1 pg (ref 26.0–34.0)
MCHC: 34.6 g/dL (ref 30.0–36.0)
MCV: 92.7 fL (ref 80.0–100.0)
Platelets: 247 10*3/uL (ref 150–400)
RBC: 4.24 MIL/uL (ref 3.87–5.11)
RDW: 12.9 % (ref 11.5–15.5)
WBC: 7.9 10*3/uL (ref 4.0–10.5)
nRBC: 0 % (ref 0.0–0.2)

## 2021-05-07 LAB — SARS CORONAVIRUS 2 (TAT 6-24 HRS): SARS Coronavirus 2: NEGATIVE

## 2021-05-07 SURGERY — BIOPSY, SKIN, SUBCUTANEOUS TISSUE, OR MUCOUS MEMBRANE
Anesthesia: General | Site: Abdomen

## 2021-05-07 MED ORDER — MIDAZOLAM HCL 2 MG/2ML IJ SOLN
INTRAMUSCULAR | Status: AC
  Filled 2021-05-07: qty 2

## 2021-05-07 MED ORDER — MIDAZOLAM HCL 5 MG/5ML IJ SOLN
INTRAMUSCULAR | Status: DC | PRN
Start: 2021-05-07 — End: 2021-05-07
  Administered 2021-05-07: 2 mg via INTRAVENOUS

## 2021-05-07 MED ORDER — FENTANYL CITRATE (PF) 250 MCG/5ML IJ SOLN
INTRAMUSCULAR | Status: AC
  Filled 2021-05-07: qty 5

## 2021-05-07 MED ORDER — LACTATED RINGERS IV SOLN: INTRAVENOUS | Status: DC | PRN

## 2021-05-07 MED ORDER — LIDOCAINE 2% (20 MG/ML) 5 ML SYRINGE
INTRAMUSCULAR | Status: AC
  Filled 2021-05-07: qty 5

## 2021-05-07 MED ORDER — FENTANYL CITRATE (PF) 250 MCG/5ML IJ SOLN
INTRAMUSCULAR | Status: DC | PRN
  Administered 2021-05-07: 100 ug via INTRAVENOUS

## 2021-05-07 MED ORDER — DEXAMETHASONE SODIUM PHOSPHATE 10 MG/ML IJ SOLN
INTRAMUSCULAR | Status: DC | PRN
  Administered 2021-05-07: 5 mg via INTRAVENOUS

## 2021-05-07 MED ORDER — PROPOFOL 10 MG/ML IV BOLUS
INTRAVENOUS | Status: AC
  Filled 2021-05-07: qty 20

## 2021-05-07 MED ORDER — SUCCINYLCHOLINE CHLORIDE 200 MG/10ML IV SOSY
PREFILLED_SYRINGE | INTRAVENOUS | Status: DC | PRN
  Administered 2021-05-07: 140 mg via INTRAVENOUS

## 2021-05-07 MED ORDER — CHLORHEXIDINE GLUCONATE 0.12 % MT SOLN
15.0000 mL | OROMUCOSAL | Status: AC
  Administered 2021-05-07: 15 mL via OROMUCOSAL
  Filled 2021-05-07 (×2): qty 15

## 2021-05-07 MED ORDER — CEFAZOLIN SODIUM-DEXTROSE 2-4 GM/100ML-% IV SOLN
2.0000 g | INTRAVENOUS | Status: AC
  Administered 2021-05-07: 2 g via INTRAVENOUS
  Filled 2021-05-07: qty 100

## 2021-05-07 MED ORDER — LIDOCAINE 2% (20 MG/ML) 5 ML SYRINGE
INTRAMUSCULAR | Status: DC | PRN
  Administered 2021-05-07: 40 mg via INTRAVENOUS

## 2021-05-07 MED ORDER — 0.9 % SODIUM CHLORIDE (POUR BTL) OPTIME
TOPICAL | Status: DC | PRN
  Administered 2021-05-07: 1000 mL

## 2021-05-07 MED ORDER — ONDANSETRON HCL 4 MG/2ML IJ SOLN
INTRAMUSCULAR | Status: DC | PRN
  Administered 2021-05-07: 4 mg via INTRAVENOUS

## 2021-05-07 MED ORDER — PHENYLEPHRINE HCL (PRESSORS) 10 MG/ML IV SOLN
INTRAVENOUS | Status: DC | PRN
  Administered 2021-05-07 (×5): 80 ug via INTRAVENOUS

## 2021-05-07 MED ORDER — PROPOFOL 10 MG/ML IV BOLUS
INTRAVENOUS | Status: DC | PRN
  Administered 2021-05-07: 170 mg via INTRAVENOUS
  Administered 2021-05-07: 30 mg via INTRAVENOUS

## 2021-05-07 MED ORDER — EPHEDRINE 5 MG/ML INJ
INTRAVENOUS | Status: AC
  Filled 2021-05-07: qty 5

## 2021-05-07 MED ORDER — PHENYLEPHRINE 40 MCG/ML (10ML) SYRINGE FOR IV PUSH (FOR BLOOD PRESSURE SUPPORT)
PREFILLED_SYRINGE | INTRAVENOUS | Status: AC
  Filled 2021-05-07: qty 10

## 2021-05-07 MED ORDER — TRAMADOL HCL 50 MG PO TABS: 50.0000 mg | ORAL_TABLET | Freq: Four times a day (QID) | ORAL | 0 refills | Status: AC | PRN

## 2021-05-07 MED ORDER — EPHEDRINE SULFATE-NACL 50-0.9 MG/10ML-% IV SOSY
PREFILLED_SYRINGE | INTRAVENOUS | Status: DC | PRN
  Administered 2021-05-07: 5 mg via INTRAVENOUS

## 2021-05-07 MED ORDER — BUPIVACAINE-EPINEPHRINE (PF) 0.25% -1:200000 IJ SOLN
INTRAMUSCULAR | Status: AC
  Filled 2021-05-07: qty 30

## 2021-05-07 MED ORDER — BUPIVACAINE-EPINEPHRINE (PF) 0.25% -1:200000 IJ SOLN
INTRAMUSCULAR | Status: DC | PRN
  Administered 2021-05-07: 30 mL

## 2021-05-07 SURGICAL SUPPLY — 39 items
ADH SKN CLS APL DERMABOND .7 (GAUZE/BANDAGES/DRESSINGS) ×1
APL PRP STRL LF DISP 70% ISPRP (MISCELLANEOUS) ×1
BAG COUNTER SPONGE SURGICOUNT (BAG) ×2 IMPLANT
BAG SPNG CNTER NS LX DISP (BAG) ×1
CANISTER SUCT 3000ML PPV (MISCELLANEOUS) ×2 IMPLANT
CHLORAPREP W/TINT 26 (MISCELLANEOUS) ×2 IMPLANT
COVER SURGICAL LIGHT HANDLE (MISCELLANEOUS) ×2 IMPLANT
DERMABOND ADVANCED (GAUZE/BANDAGES/DRESSINGS) ×1
DERMABOND ADVANCED .7 DNX12 (GAUZE/BANDAGES/DRESSINGS) ×1 IMPLANT
DRAPE LAPAROSCOPIC ABDOMINAL (DRAPES) IMPLANT
DRAPE LAPAROTOMY 100X72 PEDS (DRAPES) IMPLANT
DRSG TEGADERM 4X4.75 (GAUZE/BANDAGES/DRESSINGS) IMPLANT
ELECT REM PT RETURN 9FT ADLT (ELECTROSURGICAL) ×2
ELECTRODE REM PT RTRN 9FT ADLT (ELECTROSURGICAL) ×1 IMPLANT
GAUZE 4X4 16PLY ~~LOC~~+RFID DBL (SPONGE) IMPLANT
GAUZE SPONGE 4X4 12PLY STRL (GAUZE/BANDAGES/DRESSINGS) IMPLANT
GLOVE SURG POLY MICRO LF SZ5.5 (GLOVE) ×2 IMPLANT
GLOVE SURG UNDER POLY LF SZ6 (GLOVE) ×2 IMPLANT
GOWN STRL REUS W/ TWL LRG LVL3 (GOWN DISPOSABLE) ×1 IMPLANT
GOWN STRL REUS W/TWL LRG LVL3 (GOWN DISPOSABLE) ×2
KIT BASIN OR (CUSTOM PROCEDURE TRAY) ×2 IMPLANT
KIT TURNOVER KIT B (KITS) ×2 IMPLANT
NDL HYPO 25GX1X1/2 BEV (NEEDLE) ×1 IMPLANT
NEEDLE HYPO 25GX1X1/2 BEV (NEEDLE) ×2 IMPLANT
NS IRRIG 1000ML POUR BTL (IV SOLUTION) ×2 IMPLANT
PACK GENERAL/GYN (CUSTOM PROCEDURE TRAY) ×2 IMPLANT
PAD ARMBOARD 7.5X6 YLW CONV (MISCELLANEOUS) ×4 IMPLANT
PENCIL SMOKE EVACUATOR (MISCELLANEOUS) ×2 IMPLANT
SPECIMEN JAR SMALL (MISCELLANEOUS) ×2 IMPLANT
SPONGE T-LAP 4X18 ~~LOC~~+RFID (SPONGE) IMPLANT
STRIP CLOSURE SKIN 1/2X4 (GAUZE/BANDAGES/DRESSINGS) IMPLANT
SUT MON AB 4-0 PC3 18 (SUTURE) ×2 IMPLANT
SUT SILK 2 0 PERMA HAND 18 BK (SUTURE) IMPLANT
SUT VIC AB 3-0 SH 18 (SUTURE) IMPLANT
SUT VIC AB 3-0 SH 27 (SUTURE) ×2
SUT VIC AB 3-0 SH 27X BRD (SUTURE) ×1 IMPLANT
SYR CONTROL 10ML LL (SYRINGE) ×2 IMPLANT
TOWEL GREEN STERILE (TOWEL DISPOSABLE) ×2 IMPLANT
TOWEL GREEN STERILE FF (TOWEL DISPOSABLE) ×2 IMPLANT

## 2021-05-07 NOTE — Anesthesia Procedure Notes (Signed)
Procedure Name: Intubation Date/Time: 05/07/2021 7:42 AM Performed by: Erick Colace, RN Pre-anesthesia Checklist: Patient identified, Emergency Drugs available, Suction available, Timeout performed and Patient being monitored Patient Re-evaluated:Patient Re-evaluated prior to induction Oxygen Delivery Method: Circle system utilized Preoxygenation: Pre-oxygenation with 100% oxygen Induction Type: IV induction Ventilation: Mask ventilation without difficulty Grade View: Grade I Tube type: Oral Tube size: 4.0 mm Number of attempts: 1 Airway Equipment and Method: Stylet Placement Confirmation: ETT inserted through vocal cords under direct vision, positive ETCO2, CO2 detector and breath sounds checked- equal and bilateral Secured at: 22 cm Tube secured with: Tape Dental Injury: Teeth and Oropharynx as per pre-operative assessment  Comments: Head and neck maintained in neutral position

## 2021-05-07 NOTE — Anesthesia Postprocedure Evaluation (Signed)
Anesthesia Post Note  Patient: Zoelle Vielma  Procedure(s) Performed: EXCISIONAL BIOPSY ABDOMINAL WALL MASS (Abdomen)     Patient location during evaluation: PACU Anesthesia Type: General Level of consciousness: awake and alert Pain management: pain level controlled Vital Signs Assessment: post-procedure vital signs reviewed and stable Respiratory status: spontaneous breathing, nonlabored ventilation, respiratory function stable and patient connected to nasal cannula oxygen Cardiovascular status: blood pressure returned to baseline and stable Postop Assessment: no apparent nausea or vomiting Anesthetic complications: no   No notable events documented.  Last Vitals:  Vitals:   05/07/21 0820 05/07/21 0835  BP: (!) 149/63 (!) 147/77  Pulse:  87  Resp:  17  Temp: 36.9 C 36.9 C  SpO2: 98% 95%    Last Pain:  Vitals:   05/07/21 0835  TempSrc:   PainSc: 0-No pain                 Effie Berkshire

## 2021-05-07 NOTE — Transfer of Care (Signed)
Immediate Anesthesia Transfer of Care Note  Patient: Tina [name]  Procedure(s) Performed: EXCISIONAL BIOPSY ABDOMINAL WALL MASS (Abdomen)  Patient Location: PACU  Anesthesia Type:General  Level of Consciousness: drowsy  Airway & Oxygen Therapy: Patient Spontanous Breathing and Patient connected to face mask oxygen  Post-op Assessment: Report given to RN and Post -op Vital signs reviewed and stable  Post vital signs: Reviewed and stable  Last Vitals:  Vitals Value Taken Time  BP 149/63 05/07/21 0820  Temp    Pulse 91 05/07/21 0821  Resp 26 05/07/21 0821  SpO2 98 % 05/07/21 0821  Vitals shown include unvalidated device data.  Last Pain:  Vitals:   05/07/21 0605  TempSrc: Oral  PainSc:       Patients Stated Pain Goal: 0 ([id number])  Complications: No notable events documented.

## 2021-05-07 NOTE — Discharge Instructions (Signed)
[facility] DISCHARGE INSTRUCTIONS  Activity Ok to shower in 24 hours after surgery, but do not bathe or submerge incisions underwater. Do not drive while taking narcotic pain medication. You may resume your usual activities as tolerated.  Wound Care Your incision is covered with skin glue called Dermabond. This will peel off on its own over time. You may shower and allow warm soapy water to run over your incision. Gently pat dry. Do not submerge your incision underwater. Monitor your incision for any new redness, tenderness, or drainage.  When to Call us: Fever greater than 100.5 New redness, drainage, or swelling at incision site Severe pain, nausea, or vomiting  For questions or concerns, please call the office at [phone].

## 2021-05-07 NOTE — Interval H&P Note (Signed)
History and Physical Interval Note:  05/07/2021 6:49 AM  Tina [name]  has presented today for surgery, with the diagnosis of ABDOMINAL WALL MASS.  The various methods of treatment have been discussed with the patient and family. After consideration of risks, benefits and other options for treatment, the patient has consented to  Procedure(s): EXCISIONAL BIOPSY ABDOMINAL WALL MASS (N/A) as a surgical intervention.  The patient's history has been reviewed, patient examined, no change in status, stable for surgery.  I have reviewed the patient's chart and labs.  Questions were answered to the patient's satisfaction.     Dwan Bolt

## 2021-05-07 NOTE — Op Note (Signed)
Date: 05/07/21  Patient: Tina [name] MRN: [id number]  Preoperative Diagnosis: Lymphoma Postoperative Diagnosis: Same  Procedure: Excisional biopsy of abdominal wall mass  Surgeon: Michaelle Birks, MD  EBL: Minimal  Anesthesia: General  Specimens: abdominal wall mass  Indications: [name] is a 70 yo female who presented with a firm mass on her upper abdominal wall that she noticed about 2 months ago. Imaging workup was concerning for a neoplastic process, and she underwent an US-guided percutaneous biopsy. This showed an atypical lymphoid infiltrate that was suspicious for but not definitively diagnostic of lymphoma. A PET/CT was done and showed that the mass was hypermetabolic but showed no other areas of abnormal FDG uptake. She was referred for surgical biopsy of the mass to obtain more tissue for a definitive diagnosis prior to treatment.  Findings: Ill-defined, firm subcutaneous mass in the subcutaneous tissue of the upper abdominal/lower chest wall, approximately 4cm in diameter.  Procedure details: Informed consent was obtained in the preoperative area prior to the procedure. The patient was brought to the operating room and placed on the table in the supine position. General anesthesia was induced and appropriate lines and drains were placed for intraoperative monitoring. Perioperative antibiotics were administered per SCIP guidelines. The upper abdomen and chest were prepped and draped in the usual sterile fashion. A pre-procedure timeout was taken verifying patient identity, surgical site and procedure to be performed.  An elliptical skin incision was made over the palpable abnormality on the right upper abdominal wall over the costal margin. The subcutaneous tissue was divided with cautery. There was a firm, ill-defined mass in the subcutaneous tissue. This was circumferentially dissected out with cautery and excised. The specimen was sent fresh to pathology. The wound was irrigated  and hemostasis was achieved with cautery. The subcutaneous layer was reapproximated with interrupted 3-0 Vicryl suture. The deep dermis was closed with interrupted 3-0 Vicryl and the skin was closed with running subcuticular 4-0 monocryl suture. Dermabond was applied.  The patient tolerated the procedure well with no apparent complications. All counts were correct x2 at the end of the procedure. The patient was extubated and taken to PACU in stable condition.  Michaelle Birks, MD 05/07/21 8:28 AM

## 2021-05-08 ENCOUNTER — Encounter (HOSPITAL_COMMUNITY): Payer: Self-pay | Admitting: Surgery

## 2021-05-19 DIAGNOSIS — Z6841 Body Mass Index (BMI) 40.0 and over, adult: Secondary | ICD-10-CM | POA: Diagnosis not present

## 2021-05-19 DIAGNOSIS — E785 Hyperlipidemia, unspecified: Secondary | ICD-10-CM | POA: Diagnosis not present

## 2021-05-19 DIAGNOSIS — L659 Nonscarring hair loss, unspecified: Secondary | ICD-10-CM | POA: Diagnosis not present

## 2021-05-19 DIAGNOSIS — R7303 Prediabetes: Secondary | ICD-10-CM | POA: Diagnosis not present

## 2021-05-25 ENCOUNTER — Encounter (HOSPITAL_COMMUNITY): Payer: Self-pay

## 2021-05-26 ENCOUNTER — Telehealth: Payer: Self-pay

## 2021-05-26 NOTE — Telephone Encounter (Signed)
Called and spoke with patient to let her know that Dr. Isidore Moos had spoken with pathologist directly. Relayed that per Dr. 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 Informed patient that one of our therapists would be reaching out to her to get her CT simulation appointment scheduled, but to let me know if she has not heard form anyone by Thursday afternoon (provided patient with my direct call back number). Patient verbalized understanding and agreement, and voiced appreciation of call.

## 2021-05-27 LAB — SURGICAL PATHOLOGY

## 2021-06-01 ENCOUNTER — Encounter (HOSPITAL_COMMUNITY): Payer: Self-pay

## 2021-06-03 LAB — SURGICAL PATHOLOGY

## 2021-06-05 ENCOUNTER — Other Ambulatory Visit: Payer: Self-pay

## 2021-06-05 ENCOUNTER — Ambulatory Visit
Admission: RE | Admit: 2021-06-05 | Discharge: 2021-06-05 | Disposition: A | Discharge status: Home / Self Care | Payer: Medicare HMO | Source: Ambulatory Visit | Attending: Radiation Oncology | Admitting: Radiation Oncology

## 2021-06-05 DIAGNOSIS — C8299 Follicular lymphoma, unspecified, extranodal and solid organ sites: Secondary | ICD-10-CM | POA: Insufficient documentation

## 2021-06-05 DIAGNOSIS — Z510 Encounter for antineoplastic radiation therapy: Secondary | ICD-10-CM | POA: Insufficient documentation

## 2021-06-05 DIAGNOSIS — C8293 Follicular lymphoma, unspecified, intra-abdominal lymph nodes: Secondary | ICD-10-CM | POA: Diagnosis not present

## 2021-06-09 DIAGNOSIS — C8293 Follicular lymphoma, unspecified, intra-abdominal lymph nodes: Secondary | ICD-10-CM | POA: Diagnosis not present

## 2021-06-09 DIAGNOSIS — C8299 Follicular lymphoma, unspecified, extranodal and solid organ sites: Secondary | ICD-10-CM | POA: Diagnosis not present

## 2021-06-09 DIAGNOSIS — Z510 Encounter for antineoplastic radiation therapy: Secondary | ICD-10-CM | POA: Diagnosis not present

## 2021-06-10 ENCOUNTER — Other Ambulatory Visit: Payer: Self-pay

## 2021-06-10 ENCOUNTER — Ambulatory Visit
Admission: RE | Admit: 2021-06-10 | Discharge: 2021-06-10 | Disposition: A | Discharge status: Home / Self Care | Payer: Medicare HMO | Source: Ambulatory Visit | Attending: Radiation Oncology | Admitting: Radiation Oncology

## 2021-06-10 DIAGNOSIS — C8299 Follicular lymphoma, unspecified, extranodal and solid organ sites: Secondary | ICD-10-CM | POA: Diagnosis not present

## 2021-06-10 DIAGNOSIS — Z510 Encounter for antineoplastic radiation therapy: Secondary | ICD-10-CM | POA: Diagnosis not present

## 2021-06-10 DIAGNOSIS — C8293 Follicular lymphoma, unspecified, intra-abdominal lymph nodes: Secondary | ICD-10-CM | POA: Diagnosis not present

## 2021-06-11 ENCOUNTER — Ambulatory Visit
Admission: RE | Admit: 2021-06-11 | Discharge: 2021-06-11 | Disposition: A | Discharge status: Home / Self Care | Payer: Medicare HMO | Source: Ambulatory Visit | Attending: Radiation Oncology | Admitting: Radiation Oncology

## 2021-06-11 DIAGNOSIS — Z510 Encounter for antineoplastic radiation therapy: Secondary | ICD-10-CM | POA: Diagnosis not present

## 2021-06-11 DIAGNOSIS — C8293 Follicular lymphoma, unspecified, intra-abdominal lymph nodes: Secondary | ICD-10-CM | POA: Diagnosis not present

## 2021-06-11 DIAGNOSIS — C8299 Follicular lymphoma, unspecified, extranodal and solid organ sites: Secondary | ICD-10-CM | POA: Diagnosis not present

## 2021-06-12 ENCOUNTER — Ambulatory Visit
Admission: RE | Admit: 2021-06-12 | Discharge: 2021-06-12 | Disposition: A | Discharge status: Home / Self Care | Payer: Medicare HMO | Source: Ambulatory Visit | Attending: Radiation Oncology | Admitting: Radiation Oncology

## 2021-06-12 ENCOUNTER — Other Ambulatory Visit: Payer: Self-pay

## 2021-06-12 DIAGNOSIS — C8299 Follicular lymphoma, unspecified, extranodal and solid organ sites: Secondary | ICD-10-CM | POA: Diagnosis not present

## 2021-06-12 DIAGNOSIS — Z510 Encounter for antineoplastic radiation therapy: Secondary | ICD-10-CM | POA: Diagnosis not present

## 2021-06-12 DIAGNOSIS — C8293 Follicular lymphoma, unspecified, intra-abdominal lymph nodes: Secondary | ICD-10-CM | POA: Diagnosis not present

## 2021-06-15 ENCOUNTER — Ambulatory Visit
Admission: RE | Admit: 2021-06-15 | Discharge: 2021-06-15 | Disposition: A | Discharge status: Home / Self Care | Payer: Medicare HMO | Source: Ambulatory Visit | Attending: Radiation Oncology | Admitting: Radiation Oncology

## 2021-06-15 ENCOUNTER — Other Ambulatory Visit: Payer: Self-pay

## 2021-06-15 DIAGNOSIS — C159 Malignant neoplasm of esophagus, unspecified: Secondary | ICD-10-CM

## 2021-06-15 DIAGNOSIS — C8299 Follicular lymphoma, unspecified, extranodal and solid organ sites: Secondary | ICD-10-CM | POA: Diagnosis not present

## 2021-06-15 DIAGNOSIS — C8293 Follicular lymphoma, unspecified, intra-abdominal lymph nodes: Secondary | ICD-10-CM | POA: Diagnosis not present

## 2021-06-15 DIAGNOSIS — Z510 Encounter for antineoplastic radiation therapy: Secondary | ICD-10-CM | POA: Diagnosis not present

## 2021-06-15 MED ORDER — RADIAPLEXRX EX GEL: Freq: Once | CUTANEOUS | Status: AC

## 2021-06-15 NOTE — Progress Notes (Signed)
Pt here for patient teaching.  Pt given Radiation and You booklet, Managing Acute Radiation Side Effects for Head and Neck Cancer handout, and skin care instructions.  Reviewed areas of pertinence such as diarrhea, fatigue, hair loss, nausea and vomiting, skin changes, urinary and bladder changes, headache, and taste changes . Pt able to give teach back of to pat skin, use unscented/gentle soap, use baby wipes, have Imodium on hand, and drink plenty of water,avoid applying anything to skin within 4 hours of treatment. Pt verbalizes understanding of information given and will contact nursing with any questions or concerns.     Http://rtanswers.org/treatmentinformation/whattoexpect/index

## 2021-06-16 ENCOUNTER — Ambulatory Visit
Admission: RE | Admit: 2021-06-16 | Discharge: 2021-06-16 | Disposition: A | Discharge status: Home / Self Care | Payer: Medicare HMO | Source: Ambulatory Visit | Attending: Radiation Oncology | Admitting: Radiation Oncology

## 2021-06-16 DIAGNOSIS — C8293 Follicular lymphoma, unspecified, intra-abdominal lymph nodes: Secondary | ICD-10-CM | POA: Diagnosis not present

## 2021-06-16 DIAGNOSIS — Z510 Encounter for antineoplastic radiation therapy: Secondary | ICD-10-CM | POA: Diagnosis not present

## 2021-06-16 DIAGNOSIS — C8299 Follicular lymphoma, unspecified, extranodal and solid organ sites: Secondary | ICD-10-CM | POA: Diagnosis not present

## 2021-06-17 ENCOUNTER — Ambulatory Visit
Admission: RE | Admit: 2021-06-17 | Discharge: 2021-06-17 | Disposition: A | Discharge status: Home / Self Care | Payer: Medicare HMO | Source: Ambulatory Visit | Attending: Radiation Oncology | Admitting: Radiation Oncology

## 2021-06-17 ENCOUNTER — Other Ambulatory Visit: Payer: Self-pay

## 2021-06-17 ENCOUNTER — Telehealth: Payer: Self-pay

## 2021-06-17 DIAGNOSIS — C8299 Follicular lymphoma, unspecified, extranodal and solid organ sites: Secondary | ICD-10-CM | POA: Diagnosis not present

## 2021-06-17 DIAGNOSIS — Z510 Encounter for antineoplastic radiation therapy: Secondary | ICD-10-CM | POA: Diagnosis not present

## 2021-06-17 DIAGNOSIS — C8293 Follicular lymphoma, unspecified, intra-abdominal lymph nodes: Secondary | ICD-10-CM | POA: Diagnosis not present

## 2021-06-17 NOTE — Telephone Encounter (Signed)
Patient called & left a message stating that she had some questions. I returned her call and left a voicemail to call me back at my extension. [phone].

## 2021-06-18 ENCOUNTER — Ambulatory Visit
Admission: RE | Admit: 2021-06-18 | Discharge: 2021-06-18 | Disposition: A | Discharge status: Home / Self Care | Payer: Medicare HMO | Source: Ambulatory Visit | Attending: Radiation Oncology | Admitting: Radiation Oncology

## 2021-06-18 DIAGNOSIS — C8293 Follicular lymphoma, unspecified, intra-abdominal lymph nodes: Secondary | ICD-10-CM | POA: Diagnosis not present

## 2021-06-18 DIAGNOSIS — C8299 Follicular lymphoma, unspecified, extranodal and solid organ sites: Secondary | ICD-10-CM | POA: Diagnosis not present

## 2021-06-18 DIAGNOSIS — Z510 Encounter for antineoplastic radiation therapy: Secondary | ICD-10-CM | POA: Diagnosis not present

## 2021-06-19 ENCOUNTER — Other Ambulatory Visit: Payer: Self-pay

## 2021-06-19 ENCOUNTER — Ambulatory Visit
Admission: RE | Admit: 2021-06-19 | Discharge: 2021-06-19 | Disposition: A | Discharge status: Home / Self Care | Payer: Medicare HMO | Source: Ambulatory Visit | Attending: Radiation Oncology | Admitting: Radiation Oncology

## 2021-06-19 DIAGNOSIS — Z510 Encounter for antineoplastic radiation therapy: Secondary | ICD-10-CM | POA: Diagnosis not present

## 2021-06-19 DIAGNOSIS — C8293 Follicular lymphoma, unspecified, intra-abdominal lymph nodes: Secondary | ICD-10-CM | POA: Diagnosis not present

## 2021-06-19 DIAGNOSIS — C8299 Follicular lymphoma, unspecified, extranodal and solid organ sites: Secondary | ICD-10-CM | POA: Diagnosis not present

## 2021-06-22 ENCOUNTER — Encounter: Payer: Self-pay | Admitting: Nutrition

## 2021-06-22 ENCOUNTER — Other Ambulatory Visit: Payer: Self-pay

## 2021-06-22 ENCOUNTER — Ambulatory Visit
Admission: RE | Admit: 2021-06-22 | Discharge: 2021-06-22 | Disposition: A | Discharge status: Home / Self Care | Payer: Medicare HMO | Source: Ambulatory Visit | Attending: Radiation Oncology | Admitting: Radiation Oncology

## 2021-06-22 DIAGNOSIS — Z510 Encounter for antineoplastic radiation therapy: Secondary | ICD-10-CM | POA: Diagnosis not present

## 2021-06-22 DIAGNOSIS — C8299 Follicular lymphoma, unspecified, extranodal and solid organ sites: Secondary | ICD-10-CM | POA: Diagnosis not present

## 2021-06-22 DIAGNOSIS — C8293 Follicular lymphoma, unspecified, intra-abdominal lymph nodes: Secondary | ICD-10-CM | POA: Diagnosis not present

## 2021-06-22 NOTE — Progress Notes (Signed)
Provided 1 complementary case of Ensure Plus. 

## 2021-06-23 ENCOUNTER — Ambulatory Visit
Admission: RE | Admit: 2021-06-23 | Discharge: 2021-06-23 | Disposition: A | Discharge status: Home / Self Care | Payer: Medicare HMO | Source: Ambulatory Visit | Attending: Radiation Oncology | Admitting: Radiation Oncology

## 2021-06-23 DIAGNOSIS — C8299 Follicular lymphoma, unspecified, extranodal and solid organ sites: Secondary | ICD-10-CM | POA: Diagnosis not present

## 2021-06-23 DIAGNOSIS — C8293 Follicular lymphoma, unspecified, intra-abdominal lymph nodes: Secondary | ICD-10-CM | POA: Diagnosis not present

## 2021-06-23 DIAGNOSIS — Z510 Encounter for antineoplastic radiation therapy: Secondary | ICD-10-CM | POA: Diagnosis not present

## 2021-06-24 ENCOUNTER — Other Ambulatory Visit: Payer: Self-pay

## 2021-06-24 ENCOUNTER — Ambulatory Visit
Admission: RE | Admit: 2021-06-24 | Discharge: 2021-06-24 | Disposition: A | Discharge status: Home / Self Care | Payer: Medicare HMO | Source: Ambulatory Visit | Attending: Radiation Oncology | Admitting: Radiation Oncology

## 2021-06-24 DIAGNOSIS — C8293 Follicular lymphoma, unspecified, intra-abdominal lymph nodes: Secondary | ICD-10-CM | POA: Diagnosis not present

## 2021-06-24 DIAGNOSIS — C8299 Follicular lymphoma, unspecified, extranodal and solid organ sites: Secondary | ICD-10-CM | POA: Diagnosis not present

## 2021-06-24 DIAGNOSIS — Z510 Encounter for antineoplastic radiation therapy: Secondary | ICD-10-CM | POA: Diagnosis not present

## 2021-06-25 ENCOUNTER — Ambulatory Visit
Admission: RE | Admit: 2021-06-25 | Discharge: 2021-06-25 | Disposition: A | Discharge status: Home / Self Care | Payer: Medicare HMO | Source: Ambulatory Visit | Attending: Radiation Oncology | Admitting: Radiation Oncology

## 2021-06-25 DIAGNOSIS — C8299 Follicular lymphoma, unspecified, extranodal and solid organ sites: Secondary | ICD-10-CM | POA: Diagnosis not present

## 2021-06-25 DIAGNOSIS — C8293 Follicular lymphoma, unspecified, intra-abdominal lymph nodes: Secondary | ICD-10-CM | POA: Diagnosis not present

## 2021-06-25 DIAGNOSIS — Z510 Encounter for antineoplastic radiation therapy: Secondary | ICD-10-CM | POA: Diagnosis not present

## 2021-06-26 ENCOUNTER — Ambulatory Visit
Admission: RE | Admit: 2021-06-26 | Discharge: 2021-06-26 | Disposition: A | Discharge status: Home / Self Care | Payer: Medicare HMO | Source: Ambulatory Visit | Attending: Radiation Oncology | Admitting: Radiation Oncology

## 2021-06-26 ENCOUNTER — Other Ambulatory Visit: Payer: Self-pay

## 2021-06-26 DIAGNOSIS — C8299 Follicular lymphoma, unspecified, extranodal and solid organ sites: Secondary | ICD-10-CM | POA: Diagnosis not present

## 2021-06-26 DIAGNOSIS — Z510 Encounter for antineoplastic radiation therapy: Secondary | ICD-10-CM | POA: Diagnosis not present

## 2021-06-26 DIAGNOSIS — C8293 Follicular lymphoma, unspecified, intra-abdominal lymph nodes: Secondary | ICD-10-CM | POA: Diagnosis not present

## 2021-06-29 ENCOUNTER — Other Ambulatory Visit: Payer: Self-pay

## 2021-06-29 ENCOUNTER — Telehealth: Payer: Self-pay | Admitting: Hematology and Oncology

## 2021-06-29 ENCOUNTER — Ambulatory Visit
Admission: RE | Admit: 2021-06-29 | Discharge: 2021-06-29 | Disposition: A | Discharge status: Home / Self Care | Payer: Medicare HMO | Source: Ambulatory Visit | Attending: Radiation Oncology | Admitting: Radiation Oncology

## 2021-06-29 DIAGNOSIS — C8299 Follicular lymphoma, unspecified, extranodal and solid organ sites: Secondary | ICD-10-CM | POA: Diagnosis not present

## 2021-06-29 DIAGNOSIS — Z510 Encounter for antineoplastic radiation therapy: Secondary | ICD-10-CM | POA: Diagnosis not present

## 2021-06-29 DIAGNOSIS — C8293 Follicular lymphoma, unspecified, intra-abdominal lymph nodes: Secondary | ICD-10-CM | POA: Diagnosis not present

## 2021-06-29 NOTE — Telephone Encounter (Signed)
Scheduled per sch msg. Called and spoke with patient. Confirmed appt  

## 2021-06-30 ENCOUNTER — Ambulatory Visit
Admission: RE | Admit: 2021-06-30 | Discharge: 2021-06-30 | Disposition: A | Discharge status: Home / Self Care | Payer: Medicare HMO | Source: Ambulatory Visit | Attending: Radiation Oncology | Admitting: Radiation Oncology

## 2021-06-30 DIAGNOSIS — Z510 Encounter for antineoplastic radiation therapy: Secondary | ICD-10-CM | POA: Diagnosis not present

## 2021-06-30 DIAGNOSIS — C8299 Follicular lymphoma, unspecified, extranodal and solid organ sites: Secondary | ICD-10-CM | POA: Diagnosis not present

## 2021-06-30 DIAGNOSIS — C8293 Follicular lymphoma, unspecified, intra-abdominal lymph nodes: Secondary | ICD-10-CM | POA: Diagnosis not present

## 2021-07-01 ENCOUNTER — Ambulatory Visit
Admission: RE | Admit: 2021-07-01 | Discharge: 2021-07-01 | Disposition: A | Discharge status: Home / Self Care | Payer: Medicare HMO | Source: Ambulatory Visit | Attending: Radiation Oncology | Admitting: Radiation Oncology

## 2021-07-01 DIAGNOSIS — Z510 Encounter for antineoplastic radiation therapy: Secondary | ICD-10-CM | POA: Diagnosis not present

## 2021-07-01 DIAGNOSIS — C8293 Follicular lymphoma, unspecified, intra-abdominal lymph nodes: Secondary | ICD-10-CM | POA: Diagnosis not present

## 2021-07-01 DIAGNOSIS — C8299 Follicular lymphoma, unspecified, extranodal and solid organ sites: Secondary | ICD-10-CM | POA: Diagnosis not present

## 2021-07-02 ENCOUNTER — Ambulatory Visit
Admission: RE | Admit: 2021-07-02 | Discharge: 2021-07-02 | Disposition: A | Discharge status: Home / Self Care | Payer: Medicare HMO | Source: Ambulatory Visit | Attending: Radiation Oncology | Admitting: Radiation Oncology

## 2021-07-02 ENCOUNTER — Other Ambulatory Visit: Payer: Self-pay

## 2021-07-02 DIAGNOSIS — C8293 Follicular lymphoma, unspecified, intra-abdominal lymph nodes: Secondary | ICD-10-CM | POA: Diagnosis not present

## 2021-07-02 DIAGNOSIS — Z510 Encounter for antineoplastic radiation therapy: Secondary | ICD-10-CM | POA: Diagnosis not present

## 2021-07-02 DIAGNOSIS — C8299 Follicular lymphoma, unspecified, extranodal and solid organ sites: Secondary | ICD-10-CM | POA: Diagnosis not present

## 2021-07-03 ENCOUNTER — Encounter: Payer: Self-pay | Admitting: Radiation Oncology

## 2021-07-03 ENCOUNTER — Ambulatory Visit
Admission: RE | Admit: 2021-07-03 | Discharge: 2021-07-03 | Disposition: A | Discharge status: Home / Self Care | Payer: Medicare HMO | Source: Ambulatory Visit | Attending: Radiation Oncology | Admitting: Radiation Oncology

## 2021-07-03 DIAGNOSIS — C8299 Follicular lymphoma, unspecified, extranodal and solid organ sites: Secondary | ICD-10-CM | POA: Diagnosis not present

## 2021-07-03 DIAGNOSIS — C8293 Follicular lymphoma, unspecified, intra-abdominal lymph nodes: Secondary | ICD-10-CM | POA: Diagnosis not present

## 2021-07-03 DIAGNOSIS — Z510 Encounter for antineoplastic radiation therapy: Secondary | ICD-10-CM | POA: Diagnosis not present

## 2021-07-07 ENCOUNTER — Telehealth: Payer: Self-pay

## 2021-07-07 NOTE — Telephone Encounter (Signed)
Pt called and asks if she needs to fast for labs this week. Informed pt she does not need to fast for labs. Pt verbalized thanks and understanding.

## 2021-07-09 ENCOUNTER — Inpatient Hospital Stay: Payer: Medicare HMO

## 2021-07-09 ENCOUNTER — Inpatient Hospital Stay: Payer: Medicare HMO | Attending: Hematology and Oncology | Admitting: Hematology and Oncology

## 2021-07-09 ENCOUNTER — Other Ambulatory Visit: Payer: Self-pay

## 2021-07-09 ENCOUNTER — Other Ambulatory Visit: Payer: Self-pay | Admitting: Hematology and Oncology

## 2021-07-09 VITALS — BP 139/77 | HR 100 | Temp 98.4°F | Resp 18 | Wt 254.6 lb

## 2021-07-09 DIAGNOSIS — R1900 Intra-abdominal and pelvic swelling, mass and lump, unspecified site: Secondary | ICD-10-CM | POA: Diagnosis not present

## 2021-07-09 DIAGNOSIS — C8290 Follicular lymphoma, unspecified, unspecified site: Secondary | ICD-10-CM

## 2021-07-09 DIAGNOSIS — K5900 Constipation, unspecified: Secondary | ICD-10-CM | POA: Insufficient documentation

## 2021-07-09 DIAGNOSIS — Z885 Allergy status to narcotic agent status: Secondary | ICD-10-CM | POA: Insufficient documentation

## 2021-07-09 DIAGNOSIS — R197 Diarrhea, unspecified: Secondary | ICD-10-CM | POA: Insufficient documentation

## 2021-07-09 DIAGNOSIS — Z923 Personal history of irradiation: Secondary | ICD-10-CM | POA: Diagnosis not present

## 2021-07-09 DIAGNOSIS — Z888 Allergy status to other drugs, medicaments and biological substances status: Secondary | ICD-10-CM | POA: Insufficient documentation

## 2021-07-09 DIAGNOSIS — F17210 Nicotine dependence, cigarettes, uncomplicated: Secondary | ICD-10-CM | POA: Diagnosis not present

## 2021-07-09 DIAGNOSIS — Z79899 Other long term (current) drug therapy: Secondary | ICD-10-CM | POA: Insufficient documentation

## 2021-07-09 DIAGNOSIS — C8293 Follicular lymphoma, unspecified, intra-abdominal lymph nodes: Secondary | ICD-10-CM | POA: Diagnosis not present

## 2021-07-09 DIAGNOSIS — Z9049 Acquired absence of other specified parts of digestive tract: Secondary | ICD-10-CM | POA: Insufficient documentation

## 2021-07-09 LAB — CBC WITH DIFFERENTIAL (CANCER CENTER ONLY)
Abs Immature Granulocytes: 0.02 10*3/uL (ref 0.00–0.07)
Basophils Absolute: 0.1 10*3/uL (ref 0.0–0.1)
Basophils Relative: 1 %
Eosinophils Absolute: 0.3 10*3/uL (ref 0.0–0.5)
Eosinophils Relative: 3 %
HCT: 41.5 % (ref 36.0–46.0)
Hemoglobin: 14.5 g/dL (ref 12.0–15.0)
Immature Granulocytes: 0 %
Lymphocytes Relative: 31 %
Lymphs Abs: 2.4 10*3/uL (ref 0.7–4.0)
MCH: 32.2 pg (ref 26.0–34.0)
MCHC: 34.9 g/dL (ref 30.0–36.0)
MCV: 92 fL (ref 80.0–100.0)
Monocytes Absolute: 0.9 10*3/uL (ref 0.1–1.0)
Monocytes Relative: 11 %
Neutro Abs: 4.2 10*3/uL (ref 1.7–7.7)
Neutrophils Relative %: 54 %
Platelet Count: 229 10*3/uL (ref 150–400)
RBC: 4.51 MIL/uL (ref 3.87–5.11)
RDW: 13.1 % (ref 11.5–15.5)
WBC Count: 7.9 10*3/uL (ref 4.0–10.5)
nRBC: 0 % (ref 0.0–0.2)

## 2021-07-09 LAB — CMP (CANCER CENTER ONLY)
ALT: 15 U/L (ref 0–44)
AST: 18 U/L (ref 15–41)
Albumin: 3.7 g/dL (ref 3.5–5.0)
Alkaline Phosphatase: 82 U/L (ref 38–126)
Anion gap: 10 (ref 5–15)
BUN: 14 mg/dL (ref 8–23)
CO2: 28 mmol/L (ref 22–32)
Calcium: 9.7 mg/dL (ref 8.9–10.3)
Chloride: 105 mmol/L (ref 98–111)
Creatinine: 0.93 mg/dL (ref 0.44–1.00)
GFR, Estimated: >60 mL/min (ref >60)
Glucose, Bld: 80 mg/dL (ref 70–99)
Potassium: 4.3 mmol/L (ref 3.5–5.1)
Sodium: 143 mmol/L (ref 135–145)
Total Bilirubin: 0.3 mg/dL (ref 0.3–1.2)
Total Protein: 7.2 g/dL (ref 6.5–8.1)

## 2021-07-09 LAB — LACTATE DEHYDROGENASE: LDH: 146 U/L (ref 98–192)

## 2021-07-12 NOTE — Progress Notes (Signed)
[place] [phone]   [phone]  PROGRESS NOTE  Patient Care Team: Carol Ada, MD as PCP - General (Family Medicine)  Hematological/Oncological History # Non-Hodgkin Lymphoma, Consistent with [place]. Stage I (localized disease) s/p Radiation therapy.  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 04/03/2021: establish care with Dr. Lorenso Courier 06/10/21-07/03/2021: definitive radiation therapy  Interval History:  Tina [name] 70 y.o. 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 The patient's last visit was on 04/03/2021 at which time she established care. In the interim since the last visit she underwent radiation therapy to the lesion from 9/7 to 07/03/2021.  On exam today Tina [name] notes that she tolerated the radiation therapy quite well.  She has developed a red spot under her breast which has been itchy and erythematous.  She has been using cream as prescribed by Dr. Isidore Moos.  She reports that she has been having issues with alternating constipation and diarrhea but that this is a chronic issue.  She denies any issue with nausea, vomiting, fevers, chills, sweats.  She notes she has not noticed any other bumps or lumps on her skin.  No evidence of recurrent disease.  A full 10 point ROS is listed below.  MEDICAL HISTORY:  Past Medical History:  Diagnosis Date   Anxiety    Bronchitis    hx   COPD (chronic obstructive pulmonary disease) ([place])    smoker 1ppp   Depression    Hyperlipidemia    Smoker    1ppd    SURGICAL HISTORY: Past Surgical History:  Procedure Laterality Date   ABDOMINAL HYSTERECTOMY     BACK  SURGERY     BIOPSY OF SKIN SUBCUTANEOUS TISSUE AND/OR MUCOUS MEMBRANE N/A 05/07/2021   Procedure: EXCISIONAL BIOPSY ABDOMINAL WALL MASS;  Surgeon: Dwan Bolt, MD;  Location: [facility];  Service: General;  Laterality: N/A;   CARPAL TUNNEL RELEASE     rt   [place] SURGERY  10/04/2010   CHOLECYSTECTOMY     KNEE ARTHROSCOPY     lft   KNEE ARTHROSCOPY Right 06/10/2020   Procedure: RIGHT KNEE ARTHROSCOPY AND DEBRIDEMENT;  Surgeon: Newt Minion, MD;  Location: [facility];  Service: Orthopedics;  Laterality: Right;   LUMBAR LAMINECTOMY  09/30/2011   Procedure: MICRODISCECTOMY LUMBAR LAMINECTOMY;  Surgeon: Jessy Oto, MD;  Location: [facility];  Service: Orthopedics;  Laterality: Right;  Right L4-5 Microdiscectomy using MIS approach    SOCIAL HISTORY: Social History   Socioeconomic History   Marital status: Married    Spouse name: Not on file   Number of children: Not on file   Years of education: Not on file   Highest education level: Not on file  Occupational History   Not on file  Tobacco Use   Smoking status: Every Day    Packs/day: 1.00    Types: Cigarettes   Smokeless tobacco: Never  Substance and Sexual Activity   Alcohol use: No   Drug use: No   Sexual activity: Not on file    Comment: hysterectomy  Other Topics Concern   Not on file  Social History Narrative   Not on file   Social Determinants of Health   Financial Resource Strain: Low  Risk    Difficulty of Paying Living Expenses: Not very hard  Food Insecurity: No Food Insecurity   Worried About Running Out of Food in the Last Year: Never true   Ran Out of Food in the Last Year: Never true  Transportation Needs: No Transportation Needs   Lack of Transportation (Medical): No   Lack of Transportation (Non-Medical): No  Physical Activity: Not on file  Stress: Stress Concern Present   Feeling of Stress : To some extent  Social Connections: Moderately Isolated   Frequency of Communication with  Friends and Family: More than three times a week   Frequency of Social Gatherings with Friends and Family: More than three times a week   Attends Religious Services: Never   Marine scientist or Organizations: No   Attends Archivist Meetings: Never   Marital Status: Married  Human resources officer Violence: Not on file    FAMILY HISTORY: No family history on file.  ALLERGIES:  is allergic to prednisone, dilaudid [hydromorphone hcl], percocet [oxycodone-acetaminophen], contrast media [iodinated diagnostic agents], and tape.  MEDICATIONS:  Current Outpatient Medications  Medication Sig Dispense Refill   ALPRAZolam (XANAX) 0.5 MG tablet Take 0.5 mg by mouth 2 (two) times daily.     atorvastatin (LIPITOR) 20 MG tablet Take 20 mg by mouth daily.     CALCIUM PO Take 600 mg by mouth daily.     desipramine (NORPRAMIN) 50 MG tablet Take 50 mg by mouth daily.     ibuprofen (ADVIL) 200 MG tablet Take 200 mg by mouth every 6 (six) hours as needed for headache or moderate pain.     Multiple Vitamins-Minerals (MULTIVITAMINS THER. W/MINERALS) TABS Take 1 tablet by mouth daily.     sertraline (ZOLOFT) 100 MG tablet Take 200 mg by mouth daily.     vitamin C (ASCORBIC ACID) 500 MG tablet Take 1,000 mg by mouth daily.     No current facility-administered medications for this visit.    REVIEW OF SYSTEMS:   Constitutional: ( - ) fevers, ( - )  chills , ( - ) night sweats Eyes: ( - ) blurriness of vision, ( - ) double vision, ( - ) watery eyes Ears, nose, mouth, throat, and face: ( - ) mucositis, ( - ) sore throat Respiratory: ( - ) cough, ( - ) dyspnea, ( - ) wheezes Cardiovascular: ( - ) palpitation, ( - ) chest discomfort, ( - ) lower extremity swelling Gastrointestinal:  ( - ) nausea, ( - ) heartburn, ( - ) change in bowel habits Skin: ( - ) abnormal skin rashes Lymphatics: ( - ) new lymphadenopathy, ( - ) easy bruising Neurological: ( - ) numbness, ( - ) tingling, ( - ) new  weaknesses Behavioral/Psych: ( - ) mood change, ( - ) new changes  All other systems were reviewed with the patient and are negative.  PHYSICAL EXAMINATION: ECOG PERFORMANCE STATUS: 1 - Symptomatic but completely ambulatory  Vitals:   07/09/21 0833  BP: 139/77  Pulse: 100  Resp: 18  Temp: 98.4 F (36.9 C)  SpO2: 97%   Filed Weights   07/09/21 0833  Weight: 254 lb 9 oz (115.5 kg)    GENERAL: Well-appearing elderly Caucasian female, alert, no distress and comfortable SKIN: skin color, texture, turgor are normal, no rashes or significant lesions reddish lesion under right breast, approximately 2 to 3 inches in diameter. EYES: conjunctiva are pink and non-injected, sclera clear LUNGS: clear to auscultation and percussion with  normal breathing effort HEART: regular rate & rhythm and no murmurs and no lower extremity edema Musculoskeletal: no cyanosis of digits and no clubbing  PSYCH: alert & oriented x 3, fluent speech NEURO: no focal motor/sensory deficits  LABORATORY DATA:  I have reviewed the data as listed CBC Latest Ref Rng & Units 07/09/2021 05/07/2021 04/03/2021  WBC 4.0 - 10.5 K/uL 7.9 7.9 9.1  Hemoglobin 12.0 - 15.0 g/dL 14.5 13.6 15.1(H)  Hematocrit 36.0 - 46.0 % 41.5 39.3 43.9  Platelets 150 - 400 K/uL 229 247 252    CMP Latest Ref Rng & Units 07/09/2021 04/03/2021 09/24/2011  Glucose 70 - 99 mg/dL 80 101(H) 83  BUN 8 - 23 mg/dL 14 15 10   Creatinine 0.44 - 1.00 mg/dL 0.93 0.94 0.78  Sodium 135 - 145 mmol/L 143 140 139  Potassium 3.5 - 5.1 mmol/L 4.3 4.3 4.1  Chloride 98 - 111 mmol/L 105 106 102  CO2 22 - 32 mmol/L 28 24 29   Calcium 8.9 - 10.3 mg/dL 9.7 9.6 10.0  Total Protein 6.5 - 8.1 g/dL 7.2 7.3 -  Total Bilirubin 0.3 - 1.2 mg/dL 0.3 0.4 -  Alkaline Phos 38 - 126 U/L 82 85 -  AST 15 - 41 U/L 18 17 -  ALT 0 - 44 U/L 15 15 -    RADIOGRAPHIC STUDIES: No results found.  ASSESSMENT & PLAN Tina [name] 70 y.o. female with medical history significant for follicle  center lymphoma, cutaneous who presents for a follow up visit.   # Non-Hodgkin Lymphoma, Consistent with [place]. Stage I (localized disease) s/p Radiation therapy. -- Patient has completed palliative radiation to the lymphomatous mass of the abdomen.  This should be curative therapy --Will assess CBC, CMP, LDH return visits --Plan for PET CT scan to be performed approximately 3 months after last radiation therapy.  This should be approximately 10/02/2021 or later --Plan have the patient return to clinic in 3 months time for continued surveillance and monitoring.  Orders Placed This Encounter  Procedures   NM PET Image Restag (PS) Skull Base To Thigh    Standing Status:   Future    Standing Expiration Date:   07/12/2022    Order Specific Question:   If indicated for the ordered procedure, I authorize the administration of a radiopharmaceutical per Radiology protocol    Answer:   Yes    Order Specific Question:   Preferred imaging location?    Answer:   [facility]    All questions were answered. The patient knows to call the clinic with any problems, questions or concerns.  A total of more than 30 minutes were spent on this encounter with face-to-face time and non-face-to-face time, including preparing to see the patient, ordering tests and/or medications, counseling the patient and coordination of care as outlined above.   Ledell Peoples, MD Department of Hematology/Oncology [place] [facility] Phone: [phone] Pager: [phone] Email: [email or web address]  07/12/2021 7:11 PM

## 2021-07-28 DIAGNOSIS — E785 Hyperlipidemia, unspecified: Secondary | ICD-10-CM | POA: Diagnosis not present

## 2021-07-29 ENCOUNTER — Telehealth: Payer: Self-pay | Admitting: Hematology and Oncology

## 2021-07-29 ENCOUNTER — Other Ambulatory Visit: Payer: Self-pay

## 2021-07-29 ENCOUNTER — Ambulatory Visit
Admission: RE | Admit: 2021-07-29 | Discharge: 2021-07-29 | Disposition: A | Discharge status: Home / Self Care | Payer: Medicare HMO | Source: Ambulatory Visit | Attending: Radiation Oncology | Admitting: Radiation Oncology

## 2021-07-29 ENCOUNTER — Encounter: Payer: Self-pay | Admitting: Radiation Oncology

## 2021-07-29 VITALS — BP 119/79 | HR 100 | Temp 97.8°F | Resp 20 | Ht 63.0 in | Wt 255.2 lb

## 2021-07-29 DIAGNOSIS — Z79899 Other long term (current) drug therapy: Secondary | ICD-10-CM | POA: Insufficient documentation

## 2021-07-29 DIAGNOSIS — K5900 Constipation, unspecified: Secondary | ICD-10-CM | POA: Diagnosis not present

## 2021-07-29 DIAGNOSIS — C8510 Unspecified B-cell lymphoma, unspecified site: Secondary | ICD-10-CM | POA: Diagnosis not present

## 2021-07-29 DIAGNOSIS — R197 Diarrhea, unspecified: Secondary | ICD-10-CM | POA: Insufficient documentation

## 2021-07-29 NOTE — Progress Notes (Signed)
Patient reports mild fatigue, occasional diarrhea/ constipation, and some mild skin redness and itching chest area.  Meaningful use complete.  Dr. Lorenso Courier Medical Oncologist follow-up scheduled for late December -per patient.  BP 119/79 (BP Location: Left Arm, Patient Position: Sitting, Cuff Size: Large)   Pulse 100   Temp 97.8 F (36.6 C)   Resp 20   Ht 5\' 3"  (1.6 m)   Wt 255 lb 3.2 oz (115.8 kg)   SpO2 99%   BMI 45.21 kg/m

## 2021-07-29 NOTE — Telephone Encounter (Signed)
Scheduled per sch msg. Called and spoke with patient. Confirmed appt  

## 2021-08-03 NOTE — Progress Notes (Signed)
  Radiation Oncology         [phone] ________________________________  Name: Tina [name] MRN: [id number]  Date: 07/29/2021  DOB: [birth year]  Follow-Up Visit Note  Outpatient  CC: Tina Ada, MD  Tina Slick, MD  Diagnosis and Prior Radiotherapy:    ICD-10-CM   1. 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  Patient reports mild fatigue, occasional diarrhea/ constipation, and some mild skin redness and itching chest area.  Meaningful use complete.  Dr. Lorenso [name] Medical Oncologist follow-up scheduled for late December -per patient.  BP 119/79 (BP Location: Left Arm, Patient Position: Sitting, Cuff Size: Large)   Pulse 100   Temp 97.8 F (36.6 C)   Resp 20   Ht 5\' 3"  (1.6 m)   Wt 255 lb 3.2 oz (115.8 kg)   SpO2 99%   BMI 45.21 kg/m                                ALLERGIES:  is allergic to prednisone, dilaudid [hydromorphone hcl], percocet [oxycodone-acetaminophen], contrast media [iodinated diagnostic agents], and tape.  Meds: Current Outpatient Medications  Medication Sig Dispense Refill   ALPRAZolam (XANAX) 0.5 MG tablet Take 0.5 mg by mouth 2 (two) times daily.     atorvastatin (LIPITOR) 20 MG tablet Take 20 mg by mouth daily.     CALCIUM PO Take 600 mg by mouth daily.     desipramine (NORPRAMIN) 50 MG tablet Take 50 mg by mouth daily.     ibuprofen (ADVIL) 200 MG tablet Take 200 mg by mouth every 6 (six) hours as needed for headache or moderate pain.     Multiple Vitamins-Minerals (MULTIVITAMINS THER. W/MINERALS) TABS Take 1 tablet by mouth daily.     sertraline (ZOLOFT) 100 MG tablet Take 200 mg by mouth daily.     vitamin C (ASCORBIC ACID) 500 MG tablet Take 1,000 mg by mouth daily.     No current facility-administered medications for this encounter.    Physical Findings: The patient is in no acute distress. Patient  is alert and oriented.  height is 5\' 3"  (1.6 m) and weight is 255 lb 3.2 oz (115.8 kg). Her temperature is 97.8 F (36.6 C). Her blood pressure is 119/79 and her pulse is 100. Her respiration is 20 and oxygen saturation is 99%. .    Minimal residual erythema in the upper abdominal wall where she previously received radiation.  Surgical scar is intact and well approximated.  Lab Findings: Lab Results  Component Value Date   WBC 7.9 07/09/2021   HGB 14.5 07/09/2021   HCT 41.5 07/09/2021   MCV 92.0 07/09/2021   PLT 229 07/09/2021    Radiographic Findings: No results found.  Impression/Plan: She is healing well from treatment and will follow-up with medical oncology as scheduled.  We will refer her to dermatology for a full skin exam given her history of low-grade lymphoma of the abdominal wall, pathology suspicious for cutaneous primary.  Would like to rule out any other suspicious sites for disease, as PET scan is not a reliable means to do so.    On date of service, in total, I spent 20 minutes on this encounter. Patient was seen in person.  _____________________________________   Eppie Gibson, MD

## 2021-08-04 ENCOUNTER — Other Ambulatory Visit: Payer: Self-pay

## 2021-08-04 DIAGNOSIS — C8299 Follicular lymphoma, unspecified, extranodal and solid organ sites: Secondary | ICD-10-CM

## 2021-08-05 ENCOUNTER — Telehealth: Payer: Self-pay | Admitting: *Deleted

## 2021-08-05 NOTE — Telephone Encounter (Signed)
Called patient to inform of appt. with Rolm Bookbinder on 08-06-21- arrival time- 4 pm, spoke with patient and she is aware of this appt.

## 2021-08-06 DIAGNOSIS — L821 Other seborrheic keratosis: Secondary | ICD-10-CM | POA: Diagnosis not present

## 2021-08-06 DIAGNOSIS — L570 Actinic keratosis: Secondary | ICD-10-CM | POA: Diagnosis not present

## 2021-08-06 DIAGNOSIS — D1801 Hemangioma of skin and subcutaneous tissue: Secondary | ICD-10-CM | POA: Diagnosis not present

## 2021-08-06 DIAGNOSIS — L814 Other melanin hyperpigmentation: Secondary | ICD-10-CM | POA: Diagnosis not present

## 2021-08-11 NOTE — Progress Notes (Signed)
                                                                                                                                                             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   Plan: The patient will follow-up with radiation oncology in 1 mo. -----------------------------------  Eppie Gibson, MD

## 2021-08-20 ENCOUNTER — Ambulatory Visit: Payer: Medicare HMO | Admitting: Orthopedic Surgery

## 2021-08-20 ENCOUNTER — Ambulatory Visit (INDEPENDENT_AMBULATORY_CARE_PROVIDER_SITE_OTHER): Payer: Medicare HMO

## 2021-08-20 ENCOUNTER — Other Ambulatory Visit: Payer: Self-pay

## 2021-08-20 DIAGNOSIS — M25561 Pain in right knee: Secondary | ICD-10-CM

## 2021-08-20 DIAGNOSIS — G8929 Other chronic pain: Secondary | ICD-10-CM

## 2021-09-07 ENCOUNTER — Encounter: Payer: Self-pay | Admitting: Orthopedic Surgery

## 2021-09-07 DIAGNOSIS — M25561 Pain in right knee: Secondary | ICD-10-CM

## 2021-09-07 DIAGNOSIS — G8929 Other chronic pain: Secondary | ICD-10-CM

## 2021-09-07 MED ORDER — METHYLPREDNISOLONE ACETATE 40 MG/ML IJ SUSP
40.0000 mg | INTRAMUSCULAR | Status: AC | PRN
  Administered 2021-09-07: 40 mg via INTRA_ARTICULAR

## 2021-09-07 MED ORDER — LIDOCAINE HCL (PF) 1 % IJ SOLN
5.0000 mL | INTRAMUSCULAR | Status: AC | PRN
  Administered 2021-09-07: 5 mL

## 2021-09-07 NOTE — Progress Notes (Signed)
Office Visit Note   Patient: Tina [name]           Date of Birth: [birth year]           MRN: [id number] Visit Date: 08/20/2021              Requested by: Carol Ada, [place],  [place] [id number] PCP: Carol Ada, MD  Chief Complaint  Patient presents with   Right Knee - Pain    S/p knee scope 06/10/20      HPI: Patient is a 70 year old woman who is a year out from a right knee arthroscopy she states she still has arthritic pain complains of swelling in the popliteal fossa decreased range of motion of her knee.  Patient states that she has been treated for non-Hodgkin's lymphoma this year.  Assessment & Plan: Visit Diagnoses:  1. Chronic pain of right knee     Plan: Patient requested injection today which was provided.  Follow-up as needed.  Follow-Up Instructions: Return if symptoms worsen or fail to improve.   Ortho Exam  Patient is alert, oriented, no adenopathy, well-dressed, normal affect, normal respiratory effort. Examination of the right knee there is no effusion collaterals and cruciates are stable she is tender to palpation of the medial and lateral joint line as well as the patellofemoral joint there is crepitation with range of motion.  Imaging: No results found. No images are attached to the encounter.  Labs: Lab Results  Component Value Date   LABURIC 5.8 04/03/2021   LABURIC 5.7 07/30/2020     Lab Results  Component Value Date   ALBUMIN 3.7 07/09/2021   ALBUMIN 3.6 04/03/2021   ALBUMIN 3.6 06/23/2011    No results found for: MG No results found for: VD25OH  No results found for: PREALBUMIN CBC EXTENDED Latest Ref Rng & Units 07/09/2021 05/07/2021 04/03/2021  WBC 4.0 - 10.5 K/uL 7.9 7.9 9.1  RBC 3.87 - 5.11 MIL/uL 4.51 4.24 4.77  HGB 12.0 - 15.0 g/dL 14.5 13.6 15.1(H)  HCT 36.0 - 46.0 % 41.5 39.3 43.9  PLT 150 - 400 K/uL 229 247 252  NEUTROABS 1.7 - 7.7 K/uL 4.2 - 5.4  LYMPHSABS 0.7 - 4.0 K/uL 2.4 - 2.9      There is no height or weight on file to calculate BMI.  Orders:  Orders Placed This Encounter  Procedures   XR Knee 1-2 Views Right   No orders of the defined types were placed in this encounter.    Procedures: Large Joint Inj: R knee on 09/07/2021 12:12 PM Indications: pain and diagnostic evaluation Details: 22 G 1.5 in needle, anteromedial approach  Arthrogram: No  Medications: 5 mL lidocaine (PF) 1 %; 40 mg methylPREDNISolone acetate 40 MG/ML Outcome: tolerated well, no immediate complications Procedure, treatment alternatives, risks and benefits explained, specific risks discussed. Consent was given by the patient. Immediately prior to procedure a time out was called to verify the correct patient, procedure, equipment, support staff and site/side marked as required. Patient was prepped and draped in the usual sterile fashion.     Clinical Data: No additional findings.  ROS:  All other systems negative, except as noted in the HPI. Review of Systems  Objective: Vital Signs: There were no vitals taken for this visit.  Specialty Comments:  No specialty comments available.  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 No pertinent family history.  Past Surgical History:  Procedure Laterality Date   ABDOMINAL HYSTERECTOMY     BACK SURGERY     BIOPSY OF SKIN SUBCUTANEOUS TISSUE AND/OR MUCOUS MEMBRANE N/A 05/07/2021   Procedure: EXCISIONAL BIOPSY ABDOMINAL WALL MASS;  Surgeon: Dwan Bolt, MD;  Location: [facility];  Service: General;  Laterality: N/A;   CARPAL  TUNNEL RELEASE     rt   [place] SURGERY  10/04/2010   CHOLECYSTECTOMY     KNEE ARTHROSCOPY     lft   KNEE ARTHROSCOPY Right 06/10/2020   Procedure: RIGHT KNEE ARTHROSCOPY AND DEBRIDEMENT;  Surgeon: Newt Minion, MD;  Location: [facility];  Service: Orthopedics;  Laterality: Right;   LUMBAR LAMINECTOMY  09/30/2011   Procedure: MICRODISCECTOMY LUMBAR LAMINECTOMY;  Surgeon: Jessy Oto, MD;  Location: [facility];  Service: Orthopedics;  Laterality: Right;  Right L4-5 Microdiscectomy using MIS approach   Social History   Occupational History   Not on file  Tobacco Use   Smoking status: Every Day    Packs/day: 1.00    Types: Cigarettes   Smokeless tobacco: Never  Substance and Sexual Activity   Alcohol use: No   Drug use: No   Sexual activity: Not on file    Comment: hysterectomy

## 2021-09-08 DIAGNOSIS — S29012A Strain of muscle and tendon of back wall of thorax, initial encounter: Secondary | ICD-10-CM | POA: Diagnosis not present

## 2021-09-09 ENCOUNTER — Telehealth: Payer: Self-pay | Admitting: *Deleted

## 2021-09-09 NOTE — Telephone Encounter (Signed)
Received call from pt concerning a PET Scan that should be scheduled for this month. It has not been scheduled. Advised that I would call [facility] for her and have it scheduled. Pt voiced understanding.

## 2021-09-10 DIAGNOSIS — H8111 Benign paroxysmal vertigo, right ear: Secondary | ICD-10-CM | POA: Diagnosis not present

## 2021-09-10 NOTE — Telephone Encounter (Signed)
Call made to pt regarding her PET scan. Spoke with her and advised that it is scheduled for 10/20/21 @ 10am. She is to have nothing to eat for 6 hours prior to scan, but can have water (only) Pt voiced understanding.

## 2021-10-01 ENCOUNTER — Encounter (HOSPITAL_COMMUNITY)
Admission: RE | Admit: 2021-10-01 | Discharge: 2021-10-01 | Disposition: A | Discharge status: Home / Self Care | Payer: Medicare HMO | Source: Ambulatory Visit | Attending: Hematology and Oncology | Admitting: Hematology and Oncology

## 2021-10-01 ENCOUNTER — Other Ambulatory Visit: Payer: Self-pay

## 2021-10-01 ENCOUNTER — Other Ambulatory Visit (HOSPITAL_COMMUNITY): Payer: Medicare HMO

## 2021-10-01 DIAGNOSIS — K449 Diaphragmatic hernia without obstruction or gangrene: Secondary | ICD-10-CM | POA: Diagnosis not present

## 2021-10-01 DIAGNOSIS — J439 Emphysema, unspecified: Secondary | ICD-10-CM | POA: Insufficient documentation

## 2021-10-01 DIAGNOSIS — Z0389 Encounter for observation for other suspected diseases and conditions ruled out: Secondary | ICD-10-CM | POA: Diagnosis not present

## 2021-10-01 DIAGNOSIS — C8290 Follicular lymphoma, unspecified, unspecified site: Secondary | ICD-10-CM | POA: Diagnosis not present

## 2021-10-01 DIAGNOSIS — I700 Atherosclerosis of aorta: Secondary | ICD-10-CM | POA: Diagnosis not present

## 2021-10-01 DIAGNOSIS — I2510 Atherosclerotic heart disease of native coronary artery without angina pectoris: Secondary | ICD-10-CM | POA: Diagnosis not present

## 2021-10-01 LAB — GLUCOSE, CAPILLARY: Glucose-Capillary: 109 mg/dL — ABNORMAL HIGH (ref 70–99)

## 2021-10-01 MED ORDER — FLUDEOXYGLUCOSE F - 18 (FDG) INJECTION
12.2000 | Freq: Once | INTRAVENOUS | Status: AC | PRN
  Administered 2021-10-01: 07:00:00 12.41 via INTRAVENOUS

## 2021-10-07 ENCOUNTER — Telehealth: Payer: Self-pay | Admitting: *Deleted

## 2021-10-07 NOTE — Telephone Encounter (Signed)
Received vm message from pt requesting results of recent PET Scan.  Please advise

## 2021-10-13 ENCOUNTER — Telehealth: Payer: Self-pay | Admitting: Hematology and Oncology

## 2021-10-13 NOTE — Telephone Encounter (Signed)
Called Ms. Dubois to discuss the results of her PET CT scan.  Findings show a good response to radiation therapy with a score of Deauville 2 on the prior lymph node cluster.  This is consistent with a complete metabolic response.  We will continue to monitor the patient in clinic and have an upcoming visit later this month.  The patient voiced understanding of these findings and the plan moving forward.  Ledell Peoples, MD Department of Hematology/Oncology [place] at [facility] Phone: [phone] Pager: [phone] Email: [email or web address]

## 2021-10-20 ENCOUNTER — Ambulatory Visit (HOSPITAL_COMMUNITY): Payer: Medicare HMO

## 2021-10-29 ENCOUNTER — Other Ambulatory Visit: Payer: Self-pay

## 2021-10-29 ENCOUNTER — Encounter: Payer: Self-pay | Admitting: Hematology and Oncology

## 2021-10-29 ENCOUNTER — Inpatient Hospital Stay: Payer: Medicare HMO

## 2021-10-29 ENCOUNTER — Inpatient Hospital Stay: Payer: Medicare HMO | Attending: Hematology and Oncology | Admitting: Hematology and Oncology

## 2021-10-29 ENCOUNTER — Other Ambulatory Visit: Payer: Self-pay | Admitting: Hematology and Oncology

## 2021-10-29 VITALS — BP 156/86 | HR 97 | Temp 97.9°F | Resp 19 | Ht 63.0 in | Wt 255.1 lb

## 2021-10-29 DIAGNOSIS — Z9049 Acquired absence of other specified parts of digestive tract: Secondary | ICD-10-CM | POA: Diagnosis not present

## 2021-10-29 DIAGNOSIS — I700 Atherosclerosis of aorta: Secondary | ICD-10-CM | POA: Diagnosis not present

## 2021-10-29 DIAGNOSIS — C8290 Follicular lymphoma, unspecified, unspecified site: Secondary | ICD-10-CM

## 2021-10-29 DIAGNOSIS — F17210 Nicotine dependence, cigarettes, uncomplicated: Secondary | ICD-10-CM | POA: Insufficient documentation

## 2021-10-29 DIAGNOSIS — Z888 Allergy status to other drugs, medicaments and biological substances status: Secondary | ICD-10-CM | POA: Diagnosis not present

## 2021-10-29 DIAGNOSIS — Z923 Personal history of irradiation: Secondary | ICD-10-CM | POA: Diagnosis not present

## 2021-10-29 DIAGNOSIS — I2510 Atherosclerotic heart disease of native coronary artery without angina pectoris: Secondary | ICD-10-CM | POA: Diagnosis not present

## 2021-10-29 DIAGNOSIS — K449 Diaphragmatic hernia without obstruction or gangrene: Secondary | ICD-10-CM | POA: Diagnosis not present

## 2021-10-29 DIAGNOSIS — Z79899 Other long term (current) drug therapy: Secondary | ICD-10-CM | POA: Diagnosis not present

## 2021-10-29 DIAGNOSIS — C8293 Follicular lymphoma, unspecified, intra-abdominal lymph nodes: Secondary | ICD-10-CM | POA: Diagnosis not present

## 2021-10-29 DIAGNOSIS — R42 Dizziness and giddiness: Secondary | ICD-10-CM | POA: Diagnosis not present

## 2021-10-29 DIAGNOSIS — Z885 Allergy status to narcotic agent status: Secondary | ICD-10-CM | POA: Insufficient documentation

## 2021-10-29 DIAGNOSIS — J439 Emphysema, unspecified: Secondary | ICD-10-CM | POA: Diagnosis not present

## 2021-10-29 LAB — CMP (CANCER CENTER ONLY)
ALT: 15 U/L (ref 0–44)
AST: 16 U/L (ref 15–41)
Albumin: 4 g/dL (ref 3.5–5.0)
Alkaline Phosphatase: 80 U/L (ref 38–126)
Anion gap: 6 (ref 5–15)
BUN: 14 mg/dL (ref 8–23)
CO2: 30 mmol/L (ref 22–32)
Calcium: 9.3 mg/dL (ref 8.9–10.3)
Chloride: 104 mmol/L (ref 98–111)
Creatinine: 0.84 mg/dL (ref 0.44–1.00)
GFR, Estimated: >60 mL/min (ref >60)
Glucose, Bld: 95 mg/dL (ref 70–99)
Potassium: 4 mmol/L (ref 3.5–5.1)
Sodium: 140 mmol/L (ref 135–145)
Total Bilirubin: 0.3 mg/dL (ref 0.3–1.2)
Total Protein: 7.1 g/dL (ref 6.5–8.1)

## 2021-10-29 LAB — CBC WITH DIFFERENTIAL (CANCER CENTER ONLY)
Abs Immature Granulocytes: 0.02 10*3/uL (ref 0.00–0.07)
Basophils Absolute: 0.1 10*3/uL (ref 0.0–0.1)
Basophils Relative: 1 %
Eosinophils Absolute: 0.2 10*3/uL (ref 0.0–0.5)
Eosinophils Relative: 3 %
HCT: 41.4 % (ref 36.0–46.0)
Hemoglobin: 14.4 g/dL (ref 12.0–15.0)
Immature Granulocytes: 0 %
Lymphocytes Relative: 36 %
Lymphs Abs: 2.7 10*3/uL (ref 0.7–4.0)
MCH: 32.3 pg (ref 26.0–34.0)
MCHC: 34.8 g/dL (ref 30.0–36.0)
MCV: 92.8 fL (ref 80.0–100.0)
Monocytes Absolute: 0.6 10*3/uL (ref 0.1–1.0)
Monocytes Relative: 7 %
Neutro Abs: 4 10*3/uL (ref 1.7–7.7)
Neutrophils Relative %: 53 %
Platelet Count: 232 10*3/uL (ref 150–400)
RBC: 4.46 MIL/uL (ref 3.87–5.11)
RDW: 13.1 % (ref 11.5–15.5)
WBC Count: 7.7 10*3/uL (ref 4.0–10.5)
nRBC: 0 % (ref 0.0–0.2)

## 2021-10-29 LAB — LACTATE DEHYDROGENASE: LDH: 119 U/L (ref 98–192)

## 2021-10-29 NOTE — Progress Notes (Signed)
[facility] Telephone:([phone]   Fax:(336) [phone]  PROGRESS NOTE  Patient Care Team: Carol Ada, MD as PCP - General (Family Medicine)  Hematological/Oncological History # Non-Hodgkin Lymphoma, Consistent with [place]. Stage I (localized disease) s/p Radiation therapy.  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 04/03/2021: establish care with Dr. Lorenso Courier 06/10/21-07/03/2021: definitive radiation therapy 10/01/2021: PET CT scan showed hypermetabolic soft tissue lesion of the right chest wall is decreased in size and demonstrates decreased hypermetabolic activity. Deauville 2. Consistent with response to therapy.   Interval History:  Tina [name] 71 y.o. 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 The patient's last visit was on 07/09/2021. In the interim since the last visit she underwent a PET CT scan which showed a metabolic response to radiation therapy.  On exam today Tina [name] notes she has been well overall interim since her last visit.  She did have an episode of vertigo after her last visit which has been evaluated by her primary care provider.  She notes that her energy levels are quite good and it is 10 out of 10 today.  She does have days where she is "sluggish".  Her appetite has remained good and her weight is stable.  She does not have any additional comments questions concerns.  She denies any issue with nausea, vomiting, fevers, chills, sweats.  She notes she has not noticed any other bumps or lumps on her skin.  No evidence of recurrent disease.  A full 10 point ROS is listed below.  MEDICAL HISTORY:  Past Medical  History:  Diagnosis Date   Anxiety    Bronchitis    hx   COPD (chronic obstructive pulmonary disease) ([place])    smoker 1ppp   Depression    Hyperlipidemia    Smoker    1ppd    SURGICAL HISTORY: Past Surgical History:  Procedure Laterality Date   ABDOMINAL HYSTERECTOMY     BACK SURGERY     BIOPSY OF SKIN SUBCUTANEOUS TISSUE AND/OR MUCOUS MEMBRANE N/A 05/07/2021   Procedure: EXCISIONAL BIOPSY ABDOMINAL WALL MASS;  Surgeon: Dwan Bolt, MD;  Location: [facility];  Service: General;  Laterality: N/A;   CARPAL TUNNEL RELEASE     rt   [place] SURGERY  10/04/2010   CHOLECYSTECTOMY     KNEE ARTHROSCOPY     lft   KNEE ARTHROSCOPY Right 06/10/2020   Procedure: RIGHT KNEE ARTHROSCOPY AND DEBRIDEMENT;  Surgeon: Newt Minion, MD;  Location: [facility];  Service: Orthopedics;  Laterality: Right;   LUMBAR LAMINECTOMY  09/30/2011   Procedure: MICRODISCECTOMY LUMBAR LAMINECTOMY;  Surgeon: Jessy Oto, MD;  Location: [facility];  Service: Orthopedics;  Laterality: Right;  Right L4-5 Microdiscectomy using MIS approach    SOCIAL HISTORY: Social History   Socioeconomic History   Marital status: Married    Spouse name: Not on file   Number of children: Not on file   Years of education: Not on file   Highest education level: Not on file  Occupational History   Not on file  Tobacco Use   Smoking status: Every Day    Packs/day: 1.00    Types: Cigarettes   Smokeless tobacco: Never  Substance and Sexual Activity  Alcohol use: No   Drug use: No   Sexual activity: Not on file    Comment: hysterectomy  Other Topics Concern   Not on file  Social History Narrative   Not on file   Social Determinants of Health   Financial Resource Strain: Low Risk    Difficulty of Paying Living Expenses: Not very hard  Food Insecurity: No Food Insecurity   Worried About Running Out of Food in the Last Year: Never true   Ran Out of Food in the Last Year: Never true  Transportation  Needs: No Transportation Needs   Lack of Transportation (Medical): No   Lack of Transportation (Non-Medical): No  Physical Activity: Not on file  Stress: Stress Concern Present   Feeling of Stress : To some extent  Social Connections: Moderately Isolated   Frequency of Communication with Friends and Family: More than three times a week   Frequency of Social Gatherings with Friends and Family: More than three times a week   Attends Religious Services: Never   Marine scientist or Organizations: No   Attends Archivist Meetings: Never   Marital Status: Married  Human resources officer Violence: Not on file    FAMILY HISTORY: No family history on file.  ALLERGIES:  is allergic to prednisone, dilaudid [hydromorphone hcl], percocet [oxycodone-acetaminophen], contrast media [iodinated contrast media], and tape.  MEDICATIONS:  Current Outpatient Medications  Medication Sig Dispense Refill   ALPRAZolam (XANAX) 0.5 MG tablet Take 0.5 mg by mouth 2 (two) times daily.     atorvastatin (LIPITOR) 20 MG tablet Take 20 mg by mouth daily.     CALCIUM PO Take 600 mg by mouth daily.     desipramine (NORPRAMIN) 50 MG tablet Take 50 mg by mouth daily.     ibuprofen (ADVIL) 200 MG tablet Take 200 mg by mouth every 6 (six) hours as needed for headache or moderate pain.     Multiple Vitamins-Minerals (MULTIVITAMINS THER. W/MINERALS) TABS Take 1 tablet by mouth daily.     sertraline (ZOLOFT) 100 MG tablet Take 200 mg by mouth daily.     vitamin C (ASCORBIC ACID) 500 MG tablet Take 1,000 mg by mouth daily.     No current facility-administered medications for this visit.    REVIEW OF SYSTEMS:   Constitutional: ( - ) fevers, ( - )  chills , ( - ) night sweats Eyes: ( - ) blurriness of vision, ( - ) double vision, ( - ) watery eyes Ears, nose, mouth, throat, and face: ( - ) mucositis, ( - ) sore throat Respiratory: ( - ) cough, ( - ) dyspnea, ( - ) wheezes Cardiovascular: ( - ) palpitation, ( -  ) chest discomfort, ( - ) lower extremity swelling Gastrointestinal:  ( - ) nausea, ( - ) heartburn, ( - ) change in bowel habits Skin: ( - ) abnormal skin rashes Lymphatics: ( - ) new lymphadenopathy, ( - ) easy bruising Neurological: ( - ) numbness, ( - ) tingling, ( - ) new weaknesses Behavioral/Psych: ( - ) mood change, ( - ) new changes  All other systems were reviewed with the patient and are negative.  PHYSICAL EXAMINATION: ECOG PERFORMANCE STATUS: 1 - Symptomatic but completely ambulatory  Vitals:   10/29/21 1408  BP: (!) 156/86  Pulse: 97  Resp: 19  Temp: 97.9 F (36.6 C)  SpO2: 100%   Filed Weights   10/29/21 1408  Weight: 255 lb 1.6 oz (115.7 kg)  GENERAL: Well-appearing elderly Caucasian female, alert, no distress and comfortable SKIN: skin color, texture, turgor are normal, no rashes or significant lesions reddish lesion under right breast, approximately 2 to 3 inches in diameter. EYES: conjunctiva are pink and non-injected, sclera clear LUNGS: clear to auscultation and percussion with normal breathing effort HEART: regular rate & rhythm and no murmurs and no lower extremity edema Musculoskeletal: no cyanosis of digits and no clubbing  PSYCH: alert & oriented x 3, fluent speech NEURO: no focal motor/sensory deficits  LABORATORY DATA:  I have reviewed the data as listed CBC Latest Ref Rng & Units 10/29/2021 07/09/2021 05/07/2021  WBC 4.0 - 10.5 K/uL 7.7 7.9 7.9  Hemoglobin 12.0 - 15.0 g/dL 14.4 14.5 13.6  Hematocrit 36.0 - 46.0 % 41.4 41.5 39.3  Platelets 150 - 400 K/uL 232 229 247    CMP Latest Ref Rng & Units 10/29/2021 07/09/2021 04/03/2021  Glucose 70 - 99 mg/dL 95 80 101(H)  BUN 8 - 23 mg/dL 14 14 15   Creatinine 0.44 - 1.00 mg/dL 0.84 0.93 0.94  Sodium 135 - 145 mmol/L 140 143 140  Potassium 3.5 - 5.1 mmol/L 4.0 4.3 4.3  Chloride 98 - 111 mmol/L 104 105 106  CO2 22 - 32 mmol/L 30 28 24   Calcium 8.9 - 10.3 mg/dL 9.3 9.7 9.6  Total Protein 6.5 - 8.1 g/dL  7.1 7.2 7.3  Total Bilirubin 0.3 - 1.2 mg/dL 0.3 0.3 0.4  Alkaline Phos 38 - 126 U/L 80 82 85  AST 15 - 41 U/L 16 18 17   ALT 0 - 44 U/L 15 15 15     RADIOGRAPHIC STUDIES: NM PET Image Restag (PS) Skull Base To Thigh  Result Date: 10/01/2021 CLINICAL DATA:  Follow-up treatment strategy for follicular lymphoma. EXAM: NUCLEAR MEDICINE PET SKULL BASE TO THIGH TECHNIQUE: 12.4 mCi F-18 FDG was injected intravenously. Full-ring PET imaging was performed from the skull base to thigh after the radiotracer. CT data was obtained and used for attenuation correction and anatomic localization. Fasting blood glucose: 109 mg/dl COMPARISON:  PET-CT dated April 24, 2021 FINDINGS: Mediastinal blood pool activity: SUV max 3.8 Liver activity: SUV max 4.6 NECK: No hypermetabolic lymph nodes in the neck. Incidental CT findings: none CHEST: No hypermetabolic mediastinal or hilar nodes. No suspicious pulmonary nodules on the CT scan. Incidental CT findings: Coronary artery calcifications and mitral annular calcifications. Atherosclerotic disease of the thoracic aorta. Small hiatal hernia. Paraseptal emphysema. ABDOMEN/PELVIS: Hypermetabolic soft tissue lesion of the right chest wall is decreased in size when compared to prior exam and demonstrates decreased FDG uptake with measuring 2.4 by 0.6 cm with a SUV max of 2.9, 2.7 x 1.7 cm with an SUV max of 7.8. No abnormal hypermetabolic activity within the liver, pancreas, adrenal glands, or spleen. No hypermetabolic lymph nodes in the abdomen or pelvis. Marked edema 2 chest Incidental CT findings: Cholecystectomy clips. Atherosclerotic disease of the abdominal aorta. Diverticulosis. SKELETON: No focal hypermetabolic activity to suggest skeletal metastasis. Incidental CT findings: none IMPRESSION: 1. Hypermetabolic soft tissue lesion of the right chest wall is decreased in size and demonstrates decreased hypermetabolic activity. Deauville 2 2. Coronary artery calcifications, aortic  Atherosclerosis (ICD10-I70.0) and Emphysema (ICD10-J43.9). Electronically Signed   By: Yetta Glassman M.D.   On: 10/01/2021 17:07    ASSESSMENT & PLAN Tina [name] 71 y.o. 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   # Non-Hodgkin Lymphoma, Consistent with [place]. Stage  I (localized disease) s/p Radiation therapy. -- Patient has completed palliative radiation to the lymphomatous mass of the abdomen.  This should be curative therapy --Will assess CBC, CMP, LDH return visits --last PET CT scan showed completed response to treatment with residual lesion showing Deauville 2  --Plan have the patient return to clinic in 3 months time for continued surveillance and monitoring with repeat CT scan in 6 months time.   No orders of the defined types were placed in this encounter.   All questions were answered. The patient knows to call the clinic with any problems, questions or concerns.  A total of more than 30 minutes were spent on this encounter with face-to-face time and non-face-to-face time, including preparing to see the patient, ordering tests and/or medications, counseling the patient and coordination of care as outlined above.   Ledell Peoples, MD Department of Hematology/Oncology [place] at [facility] Phone: [phone] Pager: [phone] Email: [email or web address]  10/29/2021 3:08 PM

## 2021-12-04 DIAGNOSIS — R059 Cough, unspecified: Secondary | ICD-10-CM | POA: Diagnosis not present

## 2021-12-04 DIAGNOSIS — J069 Acute upper respiratory infection, unspecified: Secondary | ICD-10-CM | POA: Diagnosis not present

## 2021-12-04 DIAGNOSIS — Z03818 Encounter for observation for suspected exposure to other biological agents ruled out: Secondary | ICD-10-CM | POA: Diagnosis not present

## 2021-12-04 DIAGNOSIS — R509 Fever, unspecified: Secondary | ICD-10-CM | POA: Diagnosis not present

## 2021-12-04 DIAGNOSIS — R6889 Other general symptoms and signs: Secondary | ICD-10-CM | POA: Diagnosis not present

## 2021-12-04 DIAGNOSIS — R051 Acute cough: Secondary | ICD-10-CM | POA: Diagnosis not present

## 2021-12-23 DIAGNOSIS — J019 Acute sinusitis, unspecified: Secondary | ICD-10-CM | POA: Diagnosis not present

## 2022-01-28 ENCOUNTER — Other Ambulatory Visit: Payer: Self-pay | Admitting: Hematology and Oncology

## 2022-01-28 ENCOUNTER — Other Ambulatory Visit: Payer: Self-pay

## 2022-01-28 ENCOUNTER — Inpatient Hospital Stay: Payer: Medicare HMO | Attending: Hematology and Oncology

## 2022-01-28 ENCOUNTER — Inpatient Hospital Stay (HOSPITAL_BASED_OUTPATIENT_CLINIC_OR_DEPARTMENT_OTHER): Payer: Medicare HMO | Admitting: Hematology and Oncology

## 2022-01-28 VITALS — BP 130/62 | HR 99 | Temp 98.1°F | Resp 16 | Wt 255.8 lb

## 2022-01-28 DIAGNOSIS — F17210 Nicotine dependence, cigarettes, uncomplicated: Secondary | ICD-10-CM | POA: Insufficient documentation

## 2022-01-28 DIAGNOSIS — Z9049 Acquired absence of other specified parts of digestive tract: Secondary | ICD-10-CM | POA: Diagnosis not present

## 2022-01-28 DIAGNOSIS — C8290 Follicular lymphoma, unspecified, unspecified site: Secondary | ICD-10-CM

## 2022-01-28 DIAGNOSIS — Z885 Allergy status to narcotic agent status: Secondary | ICD-10-CM | POA: Insufficient documentation

## 2022-01-28 DIAGNOSIS — Z923 Personal history of irradiation: Secondary | ICD-10-CM | POA: Insufficient documentation

## 2022-01-28 DIAGNOSIS — Z79899 Other long term (current) drug therapy: Secondary | ICD-10-CM | POA: Insufficient documentation

## 2022-01-28 DIAGNOSIS — Z888 Allergy status to other drugs, medicaments and biological substances status: Secondary | ICD-10-CM | POA: Diagnosis not present

## 2022-01-28 DIAGNOSIS — C8203 Follicular lymphoma grade I, intra-abdominal lymph nodes: Secondary | ICD-10-CM | POA: Insufficient documentation

## 2022-01-28 LAB — CBC WITH DIFFERENTIAL (CANCER CENTER ONLY)
Abs Immature Granulocytes: 0.02 10*3/uL (ref 0.00–0.07)
Basophils Absolute: 0.1 10*3/uL (ref 0.0–0.1)
Basophils Relative: 1 %
Eosinophils Absolute: 0.3 10*3/uL (ref 0.0–0.5)
Eosinophils Relative: 4 %
HCT: 41.7 % (ref 36.0–46.0)
Hemoglobin: 14.3 g/dL (ref 12.0–15.0)
Immature Granulocytes: 0 %
Lymphocytes Relative: 33 %
Lymphs Abs: 2.4 10*3/uL (ref 0.7–4.0)
MCH: 32 pg (ref 26.0–34.0)
MCHC: 34.3 g/dL (ref 30.0–36.0)
MCV: 93.3 fL (ref 80.0–100.0)
Monocytes Absolute: 0.6 10*3/uL (ref 0.1–1.0)
Monocytes Relative: 9 %
Neutro Abs: 4 10*3/uL (ref 1.7–7.7)
Neutrophils Relative %: 53 %
Platelet Count: 234 10*3/uL (ref 150–400)
RBC: 4.47 MIL/uL (ref 3.87–5.11)
RDW: 13.1 % (ref 11.5–15.5)
WBC Count: 7.3 10*3/uL (ref 4.0–10.5)
nRBC: 0 % (ref 0.0–0.2)

## 2022-01-28 LAB — CMP (CANCER CENTER ONLY)
ALT: 17 U/L (ref 0–44)
AST: 17 U/L (ref 15–41)
Albumin: 4 g/dL (ref 3.5–5.0)
Alkaline Phosphatase: 72 U/L (ref 38–126)
Anion gap: 5 (ref 5–15)
BUN: 17 mg/dL (ref 8–23)
CO2: 30 mmol/L (ref 22–32)
Calcium: 9.4 mg/dL (ref 8.9–10.3)
Chloride: 106 mmol/L (ref 98–111)
Creatinine: 0.95 mg/dL (ref 0.44–1.00)
GFR, Estimated: >60 mL/min (ref >60)
Glucose, Bld: 84 mg/dL (ref 70–99)
Potassium: 3.8 mmol/L (ref 3.5–5.1)
Sodium: 141 mmol/L (ref 135–145)
Total Bilirubin: 0.3 mg/dL (ref 0.3–1.2)
Total Protein: 7 g/dL (ref 6.5–8.1)

## 2022-01-28 LAB — LACTATE DEHYDROGENASE: LDH: 122 U/L (ref 98–192)

## 2022-01-28 NOTE — Progress Notes (Signed)
?[place] ?Telephone:([phone]   [phone] ? ?PROGRESS NOTE ? ?Patient Care Team: ?Carol Ada, MD as PCP - General (Family Medicine) ? 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 ?04/03/2021: establish care with Dr. Lorenso Courier ?06/10/21-07/03/2021: definitive radiation therapy ?10/01/2021: PET CT scan showed hypermetabolic soft tissue lesion of the right chest wall is decreased in size and demonstrates decreased hypermetabolic activity. [place] 2. Consistent with response to therapy.  ? ?Interval History:  ?Tina [name] 71 y.o. 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 The patient's last visit was on 10/29/2021. In the interim since the last visit she has had no major changes in her health.  ? ?On exam today Tina [name] notes she has been well overall interim since her last visit.  She notes that she does get fatigued more easily.  She is also been having more issues with allergies and cough over the last month.  She notes that she has been put on antibiotic therapy twice per months.  She notes that she is eating well and her weight is very steady.  She notes she is not having any bumps or lumps the size of her lymph nodes.  She denies any issue with nausea, vomiting, fevers, chills, sweats.  She notes she has not noticed any other bumps or lumps on her skin.  No evidence of recurrent disease.  A full 10 point ROS is listed below. ? ?MEDICAL HISTORY:  ?Past Medical History:  ?Diagnosis Date  ? Anxiety   ? Bronchitis    ? hx  ? COPD (chronic obstructive pulmonary disease) ([place])   ? smoker 1ppp  ? Depression   ? Hyperlipidemia   ? Smoker   ? 1ppd  ? ? ?SURGICAL HISTORY: ?Past Surgical History:  ?Procedure Laterality Date  ? ABDOMINAL HYSTERECTOMY    ? BACK SURGERY    ? BIOPSY OF SKIN SUBCUTANEOUS TISSUE AND/OR MUCOUS MEMBRANE N/A 05/07/2021  ? Procedure: EXCISIONAL BIOPSY ABDOMINAL WALL MASS;  Surgeon: Dwan Bolt, MD;  Location: [facility];  Service: General;  Laterality: N/A;  ? CARPAL TUNNEL RELEASE    ? rt  ? CERVICAL DISC SURGERY  10/04/2010  ? CHOLECYSTECTOMY    ? KNEE ARTHROSCOPY    ? lft  ? KNEE ARTHROSCOPY Right 06/10/2020  ? Procedure: RIGHT KNEE ARTHROSCOPY AND DEBRIDEMENT;  Surgeon: Newt Minion, MD;  Location: [place];  Service: Orthopedics;  Laterality: Right;  ? LUMBAR LAMINECTOMY  09/30/2011  ? Procedure: MICRODISCECTOMY LUMBAR LAMINECTOMY;  Surgeon: Jessy Oto, MD;  Location: [place];  Service: Orthopedics;  Laterality: Right;  Right L4-5 Microdiscectomy using MIS approach  ? ? ?SOCIAL HISTORY: ?Social History  ? ?Socioeconomic History  ? Marital status: Married  ?  Spouse name: Not on file  ? Number of children: Not on file  ? Years of education: Not on file  ? Highest education level: Not on file  ?Occupational History  ? Not on file  ?Tobacco Use  ? Smoking status: Every Day  ?  Packs/day: 1.00  ?  Types: Cigarettes  ? Smokeless tobacco: Never  ?Substance and Sexual Activity  ? Alcohol  use: No  ? Drug use: No  ? Sexual activity: Not on file  ?  Comment: hysterectomy  ?Other Topics Concern  ? Not on file  ?Social History Narrative  ? Not on file  ? ?Social Determinants of Health  ? ?Financial Resource Strain: Low Risk   ? Difficulty of Paying Living Expenses: Not very hard  ?Food Insecurity: No Food Insecurity  ? Worried About Charity fundraiser in the Last Year: Never true  ? Ran Out of Food in the Last Year: Never true  ?Transportation Needs: No Transportation Needs  ? Lack of Transportation  (Medical): No  ? Lack of Transportation (Non-Medical): No  ?Physical Activity: Not on file  ?Stress: Stress Concern Present  ? Feeling of Stress : To some extent  ?Social Connections: Moderately Isolated  ? Frequency of Communication with Friends and Family: More than three times a week  ? Frequency of Social Gatherings with Friends and Family: More than three times a week  ? Attends Religious Services: Never  ? Active Member of Clubs or Organizations: No  ? Attends Archivist Meetings: Never  ? Marital Status: Married  ?Intimate Partner Violence: Not on file  ? ? ?FAMILY HISTORY: ?No family history on file. ? ?ALLERGIES:  is allergic to prednisone, dilaudid [hydromorphone hcl], percocet [oxycodone-acetaminophen], contrast media [iodinated contrast media], and tape. ? ?MEDICATIONS:  ?Current Outpatient Medications  ?Medication Sig Dispense Refill  ? ALPRAZolam (XANAX) 0.5 MG tablet Take 0.5 mg by mouth 2 (two) times daily.    ? atorvastatin (LIPITOR) 20 MG tablet Take 20 mg by mouth daily.    ? CALCIUM PO Take 600 mg by mouth daily.    ? desipramine (NORPRAMIN) 50 MG tablet Take 50 mg by mouth daily.    ? ibuprofen (ADVIL) 200 MG tablet Take 200 mg by mouth every 6 (six) hours as needed for headache or moderate pain.    ? Multiple Vitamins-Minerals (MULTIVITAMINS THER. W/MINERALS) TABS Take 1 tablet by mouth daily.    ? sertraline (ZOLOFT) 100 MG tablet Take 200 mg by mouth daily.    ? vitamin C (ASCORBIC ACID) 500 MG tablet Take 1,000 mg by mouth daily.    ? ?No current facility-administered medications for this visit.  ? ? ?REVIEW OF SYSTEMS:   ?Constitutional: ( - ) fevers, ( - )  chills , ( - ) night sweats ?Eyes: ( - ) blurriness of vision, ( - ) double vision, ( - ) watery eyes ?Ears, nose, mouth, throat, and face: ( - ) mucositis, ( - ) sore throat ?Respiratory: ( - ) cough, ( - ) dyspnea, ( - ) wheezes ?Cardiovascular: ( - ) palpitation, ( - ) chest discomfort, ( - ) lower extremity  swelling ?Gastrointestinal:  ( - ) nausea, ( - ) heartburn, ( - ) change in bowel habits ?Skin: ( - ) abnormal skin rashes ?Lymphatics: ( - ) new lymphadenopathy, ( - ) easy bruising ?Neurological: ( - ) numbness, ( - ) tingling, ( - ) new weaknesses ?Behavioral/Psych: ( - ) mood change, ( - ) new changes  ?All other systems were reviewed with the patient and are negative. ? ?PHYSICAL EXAMINATION: ?ECOG PERFORMANCE STATUS: 1 - Symptomatic but completely ambulatory ? ?Vitals:  ? 01/28/22 0843  ?BP: 130/62  ?Pulse: 99  ?Resp: 16  ?Temp: 98.1 ?F (36.7 ?C)  ?SpO2: 98%  ? ?Filed Weights  ? 01/28/22 0843  ?Weight: 255 lb 12.8 oz (116 kg)  ? ? ?  GENERAL: Well-appearing elderly Caucasian female, alert, no distress and comfortable ?SKIN: skin color, texture, turgor are normal, no rashes or significant lesions reddish lesion under right breast, approximately 2 to 3 inches in diameter. ?EYES: conjunctiva are pink and non-injected, sclera clear ?LUNGS: clear to auscultation and percussion with normal breathing effort ?HEART: regular rate & rhythm and no murmurs and no lower extremity edema ?Musculoskeletal: no cyanosis of digits and no clubbing  ?PSYCH: alert & oriented x 3, fluent speech ?NEURO: no focal motor/sensory deficits ? ?LABORATORY DATA:  ?I have reviewed the data as listed ? ?  Latest Ref Rng & Units 01/28/2022  ?  8:01 AM 10/29/2021  ?  1:57 PM 07/09/2021  ?  8:04 AM  ?CBC  ?WBC 4.0 - 10.5 K/uL 7.3   7.7   7.9    ?Hemoglobin 12.0 - 15.0 g/dL 14.3   14.4   14.5    ?Hematocrit 36.0 - 46.0 % 41.7   41.4   41.5    ?Platelets 150 - 400 K/uL 234   232   229    ? ? ? ?  Latest Ref Rng & Units 01/28/2022  ?  8:01 AM 10/29/2021  ?  1:57 PM 07/09/2021  ?  8:04 AM  ?CMP  ?Glucose 70 - 99 mg/dL 84   95   80    ?BUN 8 - 23 mg/dL '17   14   14    '$ ?Creatinine 0.44 - 1.00 mg/dL 0.95   0.84   0.93    ?Sodium 135 - 145 mmol/L 141   140   143    ?Potassium 3.5 - 5.1 mmol/L 3.8   4.0   4.3    ?Chloride 98 - 111 mmol/L 106   104   105    ?CO2  22 - 32 mmol/L '30   30   28    '$ ?Calcium 8.9 - 10.3 mg/dL 9.4   9.3   9.7    ?Total Protein 6.5 - 8.1 g/dL 7.0   7.1   7.2    ?Total Bilirubin 0.3 - 1.2 mg/dL 0.3   0.3   0.3    ?Alkaline Phos 38 - 126 U/L 72   80

## 2022-02-11 DIAGNOSIS — D225 Melanocytic nevi of trunk: Secondary | ICD-10-CM | POA: Diagnosis not present

## 2022-02-11 DIAGNOSIS — L814 Other melanin hyperpigmentation: Secondary | ICD-10-CM | POA: Diagnosis not present

## 2022-02-11 DIAGNOSIS — L821 Other seborrheic keratosis: Secondary | ICD-10-CM | POA: Diagnosis not present

## 2022-02-11 DIAGNOSIS — D485 Neoplasm of uncertain behavior of skin: Secondary | ICD-10-CM | POA: Diagnosis not present

## 2022-02-11 DIAGNOSIS — L918 Other hypertrophic disorders of the skin: Secondary | ICD-10-CM | POA: Diagnosis not present

## 2022-02-11 DIAGNOSIS — D1801 Hemangioma of skin and subcutaneous tissue: Secondary | ICD-10-CM | POA: Diagnosis not present

## 2022-03-25 DIAGNOSIS — Z6841 Body Mass Index (BMI) 40.0 and over, adult: Secondary | ICD-10-CM | POA: Diagnosis not present

## 2022-03-25 DIAGNOSIS — F17200 Nicotine dependence, unspecified, uncomplicated: Secondary | ICD-10-CM | POA: Diagnosis not present

## 2022-03-25 DIAGNOSIS — E785 Hyperlipidemia, unspecified: Secondary | ICD-10-CM | POA: Diagnosis not present

## 2022-03-25 DIAGNOSIS — I700 Atherosclerosis of aorta: Secondary | ICD-10-CM | POA: Diagnosis not present

## 2022-03-25 DIAGNOSIS — M7062 Trochanteric bursitis, left hip: Secondary | ICD-10-CM | POA: Diagnosis not present

## 2022-03-25 DIAGNOSIS — R7303 Prediabetes: Secondary | ICD-10-CM | POA: Diagnosis not present

## 2022-03-25 DIAGNOSIS — F3342 Major depressive disorder, recurrent, in full remission: Secondary | ICD-10-CM | POA: Diagnosis not present

## 2022-03-25 DIAGNOSIS — M7061 Trochanteric bursitis, right hip: Secondary | ICD-10-CM | POA: Diagnosis not present

## 2022-04-07 DIAGNOSIS — M25552 Pain in left hip: Secondary | ICD-10-CM | POA: Diagnosis not present

## 2022-04-07 DIAGNOSIS — M25551 Pain in right hip: Secondary | ICD-10-CM | POA: Diagnosis not present

## 2022-04-12 ENCOUNTER — Telehealth: Payer: Self-pay | Admitting: *Deleted

## 2022-04-12 NOTE — Telephone Encounter (Signed)
Received vm message from pt. She is calling about her upcoming CT scan. She is concerned as she is allergic to IV contrast. TCT patient.and spoke with her. Advised that her CT scans are ordered to be done without IV contrast, though she will need to drink the oral contrast. Advised that she can pick up the contrast at the front desk

## 2022-04-13 DIAGNOSIS — Z1231 Encounter for screening mammogram for malignant neoplasm of breast: Secondary | ICD-10-CM | POA: Diagnosis not present

## 2022-04-27 ENCOUNTER — Ambulatory Visit (HOSPITAL_COMMUNITY)
Admission: RE | Admit: 2022-04-27 | Discharge: 2022-04-27 | Disposition: A | Discharge status: Home / Self Care | Payer: Medicare HMO | Source: Ambulatory Visit | Attending: Hematology and Oncology | Admitting: Hematology and Oncology

## 2022-04-27 ENCOUNTER — Ambulatory Visit (HOSPITAL_COMMUNITY): Payer: Medicare HMO

## 2022-04-27 ENCOUNTER — Encounter (HOSPITAL_COMMUNITY): Payer: Self-pay

## 2022-04-27 DIAGNOSIS — C8290 Follicular lymphoma, unspecified, unspecified site: Secondary | ICD-10-CM | POA: Diagnosis not present

## 2022-04-27 DIAGNOSIS — J439 Emphysema, unspecified: Secondary | ICD-10-CM | POA: Diagnosis not present

## 2022-04-27 DIAGNOSIS — K6389 Other specified diseases of intestine: Secondary | ICD-10-CM | POA: Diagnosis not present

## 2022-04-27 DIAGNOSIS — K5730 Diverticulosis of large intestine without perforation or abscess without bleeding: Secondary | ICD-10-CM | POA: Diagnosis not present

## 2022-04-29 ENCOUNTER — Other Ambulatory Visit: Payer: Self-pay

## 2022-04-29 ENCOUNTER — Inpatient Hospital Stay: Payer: Medicare HMO | Attending: Hematology and Oncology

## 2022-04-29 ENCOUNTER — Other Ambulatory Visit: Payer: Self-pay | Admitting: Hematology and Oncology

## 2022-04-29 ENCOUNTER — Inpatient Hospital Stay: Payer: Medicare HMO | Admitting: Hematology and Oncology

## 2022-04-29 VITALS — BP 127/68 | HR 108 | Temp 97.6°F | Resp 17 | Wt 253.3 lb

## 2022-04-29 DIAGNOSIS — Z9049 Acquired absence of other specified parts of digestive tract: Secondary | ICD-10-CM | POA: Insufficient documentation

## 2022-04-29 DIAGNOSIS — F17210 Nicotine dependence, cigarettes, uncomplicated: Secondary | ICD-10-CM | POA: Diagnosis not present

## 2022-04-29 DIAGNOSIS — C8290 Follicular lymphoma, unspecified, unspecified site: Secondary | ICD-10-CM

## 2022-04-29 DIAGNOSIS — Z888 Allergy status to other drugs, medicaments and biological substances status: Secondary | ICD-10-CM | POA: Insufficient documentation

## 2022-04-29 DIAGNOSIS — C8293 Follicular lymphoma, unspecified, intra-abdominal lymph nodes: Secondary | ICD-10-CM | POA: Insufficient documentation

## 2022-04-29 DIAGNOSIS — K30 Functional dyspepsia: Secondary | ICD-10-CM | POA: Diagnosis not present

## 2022-04-29 DIAGNOSIS — I700 Atherosclerosis of aorta: Secondary | ICD-10-CM | POA: Diagnosis not present

## 2022-04-29 DIAGNOSIS — Z923 Personal history of irradiation: Secondary | ICD-10-CM | POA: Diagnosis not present

## 2022-04-29 DIAGNOSIS — Z885 Allergy status to narcotic agent status: Secondary | ICD-10-CM | POA: Insufficient documentation

## 2022-04-29 DIAGNOSIS — Z79899 Other long term (current) drug therapy: Secondary | ICD-10-CM | POA: Insufficient documentation

## 2022-04-29 LAB — CMP (CANCER CENTER ONLY)
ALT: 18 U/L (ref 0–44)
AST: 18 U/L (ref 15–41)
Albumin: 4 g/dL (ref 3.5–5.0)
Alkaline Phosphatase: 77 U/L (ref 38–126)
Anion gap: 6 (ref 5–15)
BUN: 15 mg/dL (ref 8–23)
CO2: 28 mmol/L (ref 22–32)
Calcium: 9.1 mg/dL (ref 8.9–10.3)
Chloride: 107 mmol/L (ref 98–111)
Creatinine: 0.98 mg/dL (ref 0.44–1.00)
GFR, Estimated: >60 mL/min (ref >60)
Glucose, Bld: 110 mg/dL — ABNORMAL HIGH (ref 70–99)
Potassium: 3.9 mmol/L (ref 3.5–5.1)
Sodium: 141 mmol/L (ref 135–145)
Total Bilirubin: 0.3 mg/dL (ref 0.3–1.2)
Total Protein: 7.2 g/dL (ref 6.5–8.1)

## 2022-04-29 LAB — LACTATE DEHYDROGENASE: LDH: 122 U/L (ref 98–192)

## 2022-04-29 LAB — CBC WITH DIFFERENTIAL (CANCER CENTER ONLY)
Abs Immature Granulocytes: 0.01 10*3/uL (ref 0.00–0.07)
Basophils Absolute: 0 10*3/uL (ref 0.0–0.1)
Basophils Relative: 1 %
Eosinophils Absolute: 0.3 10*3/uL (ref 0.0–0.5)
Eosinophils Relative: 4 %
HCT: 42.5 % (ref 36.0–46.0)
Hemoglobin: 14.8 g/dL (ref 12.0–15.0)
Immature Granulocytes: 0 %
Lymphocytes Relative: 31 %
Lymphs Abs: 2.2 10*3/uL (ref 0.7–4.0)
MCH: 32.2 pg (ref 26.0–34.0)
MCHC: 34.8 g/dL (ref 30.0–36.0)
MCV: 92.6 fL (ref 80.0–100.0)
Monocytes Absolute: 0.5 10*3/uL (ref 0.1–1.0)
Monocytes Relative: 7 %
Neutro Abs: 4.1 10*3/uL (ref 1.7–7.7)
Neutrophils Relative %: 57 %
Platelet Count: 239 10*3/uL (ref 150–400)
RBC: 4.59 MIL/uL (ref 3.87–5.11)
RDW: 13 % (ref 11.5–15.5)
WBC Count: 7.1 10*3/uL (ref 4.0–10.5)
nRBC: 0 % (ref 0.0–0.2)

## 2022-04-29 NOTE — Progress Notes (Signed)
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 04/03/2021: establish care with Dr. Lorenso Courier 06/10/21-07/03/2021: definitive radiation therapy 10/01/2021: PET CT scan showed hypermetabolic soft tissue lesion of the right chest wall is decreased in size and demonstrates decreased hypermetabolic activity. Deauville 2. Consistent with response to therapy.   Interval History:  Tina [name] 71 y.o. 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 The patient's last visit was on 01/28/2022. In the interim since the last visit she has had no major changes in her health.   On exam today [name] notes she has been well overall interim since her last visit.  She notes that she does have issues with fatigue.  Her energy is quite good today and 8 out of 10.  There are some days were also once that he was "sleep".  She notes that happens approximately few days per week.  She notes she is not having abdominal pain and she has good appetite.  Her weight has been steady.  She does not have any bumps or lumps concerning for lymphadenopathy.  She notes a CT scan went well but she got upset stomach from the contrast.  Other than that she has not had any nausea, vomiting, or diarrhea.  She does have an interest in quitting smoking and we discussed some of that today.  She denies any issue with nausea,  vomiting, fevers, chills, sweats.  No evidence of recurrent disease.  A full 10 point ROS is listed below.  MEDICAL HISTORY:  Past Medical History:  Diagnosis Date   Anxiety    Bronchitis    hx   COPD (chronic obstructive pulmonary disease) ([place])    smoker 1ppp   Depression    Hyperlipidemia    Smoker    1ppd    SURGICAL HISTORY: Past Surgical History:  Procedure Laterality Date   ABDOMINAL HYSTERECTOMY     BACK SURGERY     BIOPSY OF SKIN SUBCUTANEOUS TISSUE AND/OR MUCOUS MEMBRANE N/A 05/07/2021   Procedure: EXCISIONAL BIOPSY ABDOMINAL WALL MASS;  Surgeon: Dwan Bolt, MD;  Location: [facility];  Service: General;  Laterality: N/A;   CARPAL TUNNEL RELEASE     rt   [place] SURGERY  10/04/2010   CHOLECYSTECTOMY     KNEE ARTHROSCOPY     lft   KNEE ARTHROSCOPY Right 06/10/2020   Procedure: RIGHT KNEE ARTHROSCOPY AND DEBRIDEMENT;  Surgeon: Newt Minion, MD;  Location: [facility];  Service: Orthopedics;  Laterality: Right;   LUMBAR LAMINECTOMY  09/30/2011   Procedure: MICRODISCECTOMY LUMBAR LAMINECTOMY;  Surgeon: Jessy Oto, MD;  Location: [facility];  Service: Orthopedics;  Laterality: Right;  Right L4-5 Microdiscectomy using MIS approach    SOCIAL HISTORY: Social History   Socioeconomic History   Marital status: Married    Spouse name: Not on file   Number of children: Not on file   Years of education: Not on file   Highest education level: Not on file  Occupational History  Not on file  Tobacco Use   Smoking status: Every Day    Packs/day: 1.00    Types: Cigarettes   Smokeless tobacco: Never  Substance and Sexual Activity   Alcohol use: No   Drug use: No   Sexual activity: Not on file    Comment: hysterectomy  Other Topics Concern   Not on file  Social History Narrative   Not on file   Social Determinants of Health   Financial Resource Strain: Low Risk  (04/30/2021)   Overall Financial Resource Strain (CARDIA)    Difficulty of Paying  Living Expenses: Not very hard  Food Insecurity: No Food Insecurity (04/30/2021)   Hunger Vital Sign    Worried About Running Out of Food in the Last Year: Never true    Ran Out of Food in the Last Year: Never true  Transportation Needs: No Transportation Needs (04/30/2021)   PRAPARE - Hydrologist (Medical): No    Lack of Transportation (Non-Medical): No  Physical Activity: Not on file  Stress: Stress Concern Present (04/30/2021)   [place]    Feeling of Stress : To some extent  Social Connections: Moderately Isolated (04/30/2021)   Social Connection and Isolation Panel [NHANES]    Frequency of Communication with Friends and Family: More than three times a week    Frequency of Social Gatherings with Friends and Family: More than three times a week    Attends Religious Services: Never    Marine scientist or Organizations: No    Attends Archivist Meetings: Never    Marital Status: Married  Human resources officer Violence: Not on file    FAMILY HISTORY: No family history on file.  ALLERGIES:  is allergic to prednisone, dilaudid [hydromorphone hcl], percocet [oxycodone-acetaminophen], contrast media [iodinated contrast media], and tape.  MEDICATIONS:  Current Outpatient Medications  Medication Sig Dispense Refill   ALPRAZolam (XANAX) 0.5 MG tablet Take 0.5 mg by mouth 2 (two) times daily.     atorvastatin (LIPITOR) 20 MG tablet Take 20 mg by mouth daily.     CALCIUM PO Take 600 mg by mouth daily.     desipramine (NORPRAMIN) 50 MG tablet Take 50 mg by mouth daily.     ibuprofen (ADVIL) 200 MG tablet Take 200 mg by mouth every 6 (six) hours as needed for headache or moderate pain.     Multiple Vitamins-Minerals (MULTIVITAMINS THER. W/MINERALS) TABS Take 1 tablet by mouth daily.     sertraline (ZOLOFT) 100 MG tablet Take 200 mg by mouth daily.     vitamin C (ASCORBIC ACID) 500 MG  tablet Take 1,000 mg by mouth daily.     No current facility-administered medications for this visit.    REVIEW OF SYSTEMS:   Constitutional: ( - ) fevers, ( - )  chills , ( - ) night sweats Eyes: ( - ) blurriness of vision, ( - ) double vision, ( - ) watery eyes Ears, nose, mouth, throat, and face: ( - ) mucositis, ( - ) sore throat Respiratory: ( - ) cough, ( - ) dyspnea, ( - ) wheezes Cardiovascular: ( - ) palpitation, ( - ) chest discomfort, ( - ) lower extremity swelling Gastrointestinal:  ( - ) nausea, ( - ) heartburn, ( - ) change in bowel habits Skin: ( - ) abnormal skin rashes Lymphatics: ( - ) new lymphadenopathy, ( - ) easy bruising Neurological: ( - )  numbness, ( - ) tingling, ( - ) new weaknesses Behavioral/Psych: ( - ) mood change, ( - ) new changes  All other systems were reviewed with the patient and are negative.  PHYSICAL EXAMINATION: ECOG PERFORMANCE STATUS: 1 - Symptomatic but completely ambulatory  Vitals:   04/29/22 0833  BP: 127/68  Pulse: (!) 108  Resp: 17  Temp: 97.6 F (36.4 C)  SpO2: 97%   Filed Weights   04/29/22 0833  Weight: 253 lb 4.8 oz (114.9 kg)    GENERAL: Well-appearing elderly Caucasian female, alert, no distress and comfortable SKIN: skin color, texture, turgor are normal, no rashes or significant lesions reddish lesion under right breast, approximately 2 to 3 inches in diameter. EYES: conjunctiva are pink and non-injected, sclera clear LUNGS: clear to auscultation and percussion with normal breathing effort HEART: regular rate & rhythm and no murmurs and no lower extremity edema Musculoskeletal: no cyanosis of digits and no clubbing  PSYCH: alert & oriented x 3, fluent speech NEURO: no focal motor/sensory deficits  LABORATORY DATA:  I have reviewed the data as listed    Latest Ref Rng & Units 04/29/2022    8:08 AM 01/28/2022    8:01 AM 10/29/2021    1:57 PM  CBC  WBC 4.0 - 10.5 K/uL 7.1  7.3  7.7   Hemoglobin 12.0 - 15.0 g/dL  14.8  14.3  14.4   Hematocrit 36.0 - 46.0 % 42.5  41.7  41.4   Platelets 150 - 400 K/uL 239  234  232        Latest Ref Rng & Units 04/29/2022    8:08 AM 01/28/2022    8:01 AM 10/29/2021    1:57 PM  CMP  Glucose 70 - 99 mg/dL 110  84  95   BUN 8 - 23 mg/dL '15  17  14   '$ Creatinine 0.44 - 1.00 mg/dL 0.98  0.95  0.84   Sodium 135 - 145 mmol/L 141  141  140   Potassium 3.5 - 5.1 mmol/L 3.9  3.8  4.0   Chloride 98 - 111 mmol/L 107  106  104   CO2 22 - 32 mmol/L '28  30  30   '$ Calcium 8.9 - 10.3 mg/dL 9.1  9.4  9.3   Total Protein 6.5 - 8.1 g/dL 7.2  7.0  7.1   Total Bilirubin 0.3 - 1.2 mg/dL 0.3  0.3  0.3   Alkaline Phos 38 - 126 U/L 77  72  80   AST 15 - 41 U/L '18  17  16   '$ ALT 0 - 44 U/L '18  17  15     '$ RADIOGRAPHIC STUDIES: CT Chest Wo Contrast  Result Date: 04/27/2022 CLINICAL DATA:  History of follicular lymphoma, monitor. * Tracking Code: BO * EXAM: CT CHEST, ABDOMEN AND PELVIS WITHOUT CONTRAST TECHNIQUE: Multidetector CT imaging of the chest, abdomen and pelvis was performed following the standard protocol without IV contrast. RADIATION DOSE REDUCTION: This exam was performed according to the departmental dose-optimization program which includes automated exposure control, adjustment of the mA and/or kV according to patient size and/or use of iterative reconstruction technique. COMPARISON:  Multiple priors including most recent PET-CT October 01, 2021. FINDINGS: CT CHEST FINDINGS Cardiovascular: Aortic and branch vessel atherosclerosis without thoracic aortic aneurysm. Normal size heart. Coronary artery and mitral annular calcifications. No significant pericardial effusion/thickening. Mediastinum/Nodes: No suspicious thyroid nodule. No pathologically enlarged mediastinal, hilar or axillary lymph nodes. Small hiatal hernia. Lungs/Pleura: Paraseptal emphysema. Scarring/atelectasis  in the lingula. No suspicious pulmonary nodules or masses. No pleural effusion. No pneumothorax. Musculoskeletal:  Partially visualized anterior cervical fusion hardware. Multilevel degenerative changes spine. Degenerative changes bilateral shoulders. Right anterior chest wall lesion now measures 15 x 11 mm on image 104/2 previously 24 x 6 mm. CT ABDOMEN PELVIS FINDINGS Hepatobiliary: No suspicious hepatic lesion on this noncontrast enhanced CT. Gallbladder surgically absent. No biliary ductal dilation. Pancreas: No pancreatic ductal dilation or evidence of acute inflammation. Spleen: No splenomegaly.  Calcified splenic granulomata. Adrenals/Urinary Tract: Bilateral adrenal glands are within normal limits. No hydronephrosis. No nephrolithiasis. Urinary bladder is nondistended limiting evaluation. Stomach/Bowel: Radiopaque enteric contrast material traverses the rectum. Stomach is minimally distended limiting evaluation. No pathologic dilation of small or large bowel. The appendix and terminal ileum appear normal. Sigmoid colonic diverticulosis without findings of acute diverticulitis. No suspicious colonic wall thickening or mass like lesions identified. Vascular/Lymphatic: Aortic and branch vessel atherosclerosis with similar focal ectasia of the infrarenal abdominal aorta measuring 2.6 cm on image 32/2 with the aorta just proximal to this measuring 1.8 cm on image 29/2, but not aneurysmal by size criteria. No pathologically enlarged abdominal or pelvic lymph nodes. Reproductive: No acute or suspicious finding. Other: No significant abdominopelvic free fluid. Musculoskeletal: No aggressive lytic or blastic lesion of bone. Multilevel degenerative changes spine. IMPRESSION: 1. Decreased size of the right chest wall lesion. 2. No adenopathy above or below the diaphragm and no splenomegaly. 3.  Aortic Atherosclerosis (ICD10-I70.0). Electronically Signed   By: Dahlia Bailiff M.D.   On: 04/27/2022 10:44   CT Abdomen Pelvis Wo Contrast  Result Date: 04/27/2022 CLINICAL DATA:  History of follicular lymphoma, monitor. * Tracking  Code: BO * EXAM: CT CHEST, ABDOMEN AND PELVIS WITHOUT CONTRAST TECHNIQUE: Multidetector CT imaging of the chest, abdomen and pelvis was performed following the standard protocol without IV contrast. RADIATION DOSE REDUCTION: This exam was performed according to the departmental dose-optimization program which includes automated exposure control, adjustment of the mA and/or kV according to patient size and/or use of iterative reconstruction technique. COMPARISON:  Multiple priors including most recent PET-CT October 01, 2021. FINDINGS: CT CHEST FINDINGS Cardiovascular: Aortic and branch vessel atherosclerosis without thoracic aortic aneurysm. Normal size heart. Coronary artery and mitral annular calcifications. No significant pericardial effusion/thickening. Mediastinum/Nodes: No suspicious thyroid nodule. No pathologically enlarged mediastinal, hilar or axillary lymph nodes. Small hiatal hernia. Lungs/Pleura: Paraseptal emphysema. Scarring/atelectasis in the lingula. No suspicious pulmonary nodules or masses. No pleural effusion. No pneumothorax. Musculoskeletal: Partially visualized anterior cervical fusion hardware. Multilevel degenerative changes spine. Degenerative changes bilateral shoulders. Right anterior chest wall lesion now measures 15 x 11 mm on image 104/2 previously 24 x 6 mm. CT ABDOMEN PELVIS FINDINGS Hepatobiliary: No suspicious hepatic lesion on this noncontrast enhanced CT. Gallbladder surgically absent. No biliary ductal dilation. Pancreas: No pancreatic ductal dilation or evidence of acute inflammation. Spleen: No splenomegaly.  Calcified splenic granulomata. Adrenals/Urinary Tract: Bilateral adrenal glands are within normal limits. No hydronephrosis. No nephrolithiasis. Urinary bladder is nondistended limiting evaluation. Stomach/Bowel: Radiopaque enteric contrast material traverses the rectum. Stomach is minimally distended limiting evaluation. No pathologic dilation of small or large bowel.  The appendix and terminal ileum appear normal. Sigmoid colonic diverticulosis without findings of acute diverticulitis. No suspicious colonic wall thickening or mass like lesions identified. Vascular/Lymphatic: Aortic and branch vessel atherosclerosis with similar focal ectasia of the infrarenal abdominal aorta measuring 2.6 cm on image 32/2 with the aorta just proximal to this measuring 1.8 cm on image 29/2, but not  aneurysmal by size criteria. No pathologically enlarged abdominal or pelvic lymph nodes. Reproductive: No acute or suspicious finding. Other: No significant abdominopelvic free fluid. Musculoskeletal: No aggressive lytic or blastic lesion of bone. Multilevel degenerative changes spine. IMPRESSION: 1. Decreased size of the right chest wall lesion. 2. No adenopathy above or below the diaphragm and no splenomegaly. 3.  Aortic Atherosclerosis (ICD10-I70.0). Electronically Signed   By: Dahlia Bailiff M.D.   On: 04/27/2022 10:44    ASSESSMENT & PLAN Tonesha Tsou Nickell 71 y.o. 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 -- Patient has completed palliative radiation to the lymphomatous mass of the abdomen.  This should be curative therapy --Will assess CBC, CMP, LDH at return visits --labs today show [place] blood cell count 7.1, hemoglobin 14.8, MCV 92.6, and platelets of 239. --post treatment PET CT scan on 10/01/2021 showed completed response to treatment with residual lesion showing Deauville 2  --continue CT scan q 6 months x 2 years, followed by annually x 3 years.  --Plan have the patient return to clinic in 3 months with repeat CT scan in 6 months.   No orders of the defined types were placed in this encounter.   All questions were answered. The patient knows to call the clinic with any problems, questions or  concerns.  A total of more than 30 minutes were spent on this encounter with face-to-face time and non-face-to-face time, including preparing to see the patient, ordering tests and/or medications, counseling the patient and coordination of care as outlined above.   Ledell Peoples, MD Department of Hematology/Oncology [place] [facility] Phone: [phone] Pager: [phone] Email: [email or web address]  04/29/2022 9:01 AM

## 2022-06-02 ENCOUNTER — Encounter: Payer: Self-pay | Admitting: Hematology and Oncology

## 2022-07-30 ENCOUNTER — Inpatient Hospital Stay: Payer: Medicare HMO | Attending: Physician Assistant

## 2022-07-30 ENCOUNTER — Other Ambulatory Visit: Payer: Self-pay

## 2022-07-30 ENCOUNTER — Inpatient Hospital Stay (HOSPITAL_BASED_OUTPATIENT_CLINIC_OR_DEPARTMENT_OTHER): Payer: Medicare HMO | Admitting: Physician Assistant

## 2022-07-30 VITALS — BP 122/67 | HR 102 | Temp 97.8°F | Resp 20 | Wt 255.4 lb

## 2022-07-30 DIAGNOSIS — R519 Headache, unspecified: Secondary | ICD-10-CM | POA: Insufficient documentation

## 2022-07-30 DIAGNOSIS — C8290 Follicular lymphoma, unspecified, unspecified site: Secondary | ICD-10-CM

## 2022-07-30 DIAGNOSIS — R1900 Intra-abdominal and pelvic swelling, mass and lump, unspecified site: Secondary | ICD-10-CM | POA: Diagnosis not present

## 2022-07-30 DIAGNOSIS — Z885 Allergy status to narcotic agent status: Secondary | ICD-10-CM | POA: Diagnosis not present

## 2022-07-30 DIAGNOSIS — C9000 Multiple myeloma not having achieved remission: Secondary | ICD-10-CM | POA: Diagnosis not present

## 2022-07-30 DIAGNOSIS — Z923 Personal history of irradiation: Secondary | ICD-10-CM | POA: Diagnosis not present

## 2022-07-30 DIAGNOSIS — Z888 Allergy status to other drugs, medicaments and biological substances status: Secondary | ICD-10-CM | POA: Insufficient documentation

## 2022-07-30 DIAGNOSIS — Z79899 Other long term (current) drug therapy: Secondary | ICD-10-CM | POA: Diagnosis not present

## 2022-07-30 DIAGNOSIS — R229 Localized swelling, mass and lump, unspecified: Secondary | ICD-10-CM | POA: Diagnosis not present

## 2022-07-30 DIAGNOSIS — F17210 Nicotine dependence, cigarettes, uncomplicated: Secondary | ICD-10-CM | POA: Diagnosis not present

## 2022-07-30 DIAGNOSIS — Z9049 Acquired absence of other specified parts of digestive tract: Secondary | ICD-10-CM | POA: Diagnosis not present

## 2022-07-30 LAB — CMP (CANCER CENTER ONLY)
ALT: 15 U/L (ref 0–44)
AST: 16 U/L (ref 15–41)
Albumin: 4 g/dL (ref 3.5–5.0)
Alkaline Phosphatase: 78 U/L (ref 38–126)
Anion gap: 3 — ABNORMAL LOW (ref 5–15)
BUN: 15 mg/dL (ref 8–23)
CO2: 30 mmol/L (ref 22–32)
Calcium: 9.7 mg/dL (ref 8.9–10.3)
Chloride: 107 mmol/L (ref 98–111)
Creatinine: 1.07 mg/dL — ABNORMAL HIGH (ref 0.44–1.00)
GFR, Estimated: 56 mL/min — ABNORMAL LOW (ref >60)
Glucose, Bld: 95 mg/dL (ref 70–99)
Potassium: 4.5 mmol/L (ref 3.5–5.1)
Sodium: 140 mmol/L (ref 135–145)
Total Bilirubin: 0.3 mg/dL (ref 0.3–1.2)
Total Protein: 7.3 g/dL (ref 6.5–8.1)

## 2022-07-30 LAB — CBC WITH DIFFERENTIAL (CANCER CENTER ONLY)
Abs Immature Granulocytes: 0.01 10*3/uL (ref 0.00–0.07)
Basophils Absolute: 0 10*3/uL (ref 0.0–0.1)
Basophils Relative: 1 %
Eosinophils Absolute: 0.2 10*3/uL (ref 0.0–0.5)
Eosinophils Relative: 3 %
HCT: 43.3 % (ref 36.0–46.0)
Hemoglobin: 15.1 g/dL — ABNORMAL HIGH (ref 12.0–15.0)
Immature Granulocytes: 0 %
Lymphocytes Relative: 33 %
Lymphs Abs: 2.6 10*3/uL (ref 0.7–4.0)
MCH: 32.8 pg (ref 26.0–34.0)
MCHC: 34.9 g/dL (ref 30.0–36.0)
MCV: 94.1 fL (ref 80.0–100.0)
Monocytes Absolute: 0.6 10*3/uL (ref 0.1–1.0)
Monocytes Relative: 8 %
Neutro Abs: 4.3 10*3/uL (ref 1.7–7.7)
Neutrophils Relative %: 55 %
Platelet Count: 249 10*3/uL (ref 150–400)
RBC: 4.6 MIL/uL (ref 3.87–5.11)
RDW: 13.2 % (ref 11.5–15.5)
WBC Count: 7.8 10*3/uL (ref 4.0–10.5)
nRBC: 0 % (ref 0.0–0.2)

## 2022-07-30 LAB — LACTATE DEHYDROGENASE: LDH: 109 U/L (ref 98–192)

## 2022-07-30 NOTE — Progress Notes (Signed)
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 04/03/2021: establish care with Dr. Lorenso Courier 06/10/21-07/03/2021: definitive radiation therapy 10/01/2021: PET CT scan showed hypermetabolic soft tissue lesion of the right chest wall is decreased in size and demonstrates decreased hypermetabolic activity. Deauville 2. Consistent with response to therapy.   Interval History:  Tina [name] 71 y.o. 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 The patient's last visit was on 04/29/2022. In the interim since the last visit she has had no major changes in her health.   On exam today Tina [name] reports that she is feeling well with occasional episodes of fatigue.  She continues to complete all her daily activities on her own.  She has a good appetite and denies any significant weight changes. She denies nausea, vomiting or abdominal pain. Her bowel habits are unchanged without any recurrent episodes of diarrhea or constipation. She denies easy bruising or signs of active bleeding. She reports intermittent episodes of headaches secondary to sinuses. She denies fevers, chills, sweats, shortness of breath, chest pain or cough.No evidence of recurrent disease.  A full 10 point ROS is listed below.  MEDICAL HISTORY:  Past Medical History:  Diagnosis Date   Anxiety    Bronchitis    hx    COPD (chronic obstructive pulmonary disease) ([place])    smoker 1ppp   Depression    Hyperlipidemia    Smoker    1ppd    SURGICAL HISTORY: Past Surgical History:  Procedure Laterality Date   ABDOMINAL HYSTERECTOMY     BACK SURGERY     BIOPSY OF SKIN SUBCUTANEOUS TISSUE AND/OR MUCOUS MEMBRANE N/A 05/07/2021   Procedure: EXCISIONAL BIOPSY ABDOMINAL WALL MASS;  Surgeon: Dwan Bolt, MD;  Location: [place];  Service: General;  Laterality: N/A;   CARPAL TUNNEL RELEASE     rt   [place] SURGERY  10/04/2010   CHOLECYSTECTOMY     KNEE ARTHROSCOPY     lft   KNEE ARTHROSCOPY Right 06/10/2020   Procedure: RIGHT KNEE ARTHROSCOPY AND DEBRIDEMENT;  Surgeon: Newt Minion, MD;  Location: [place];  Service: Orthopedics;  Laterality: Right;   LUMBAR LAMINECTOMY  09/30/2011   Procedure: MICRODISCECTOMY LUMBAR LAMINECTOMY;  Surgeon: Jessy Oto, MD;  Location: [place];  Service: Orthopedics;  Laterality: Right;  Right L4-5 Microdiscectomy using MIS approach    SOCIAL HISTORY: Social History   Socioeconomic History   Marital status: Married    Spouse name: Not on file   Number of children: Not on file   Years of education: Not on file   Highest education level: Not on file  Occupational History   Not on file  Tobacco Use   Smoking status: Every Day    Packs/day: 1.00    Types: Cigarettes   Smokeless tobacco: Never  Substance and Sexual Activity   Alcohol use: No   Drug use: No   Sexual activity: Not on file    Comment: hysterectomy  Other Topics Concern  Not on file  Social History Narrative   Not on file   Social Determinants of Health   Financial Resource Strain: Low Risk  (04/30/2021)   Overall Financial Resource Strain (CARDIA)    Difficulty of Paying Living Expenses: Not very hard  Food Insecurity: No Food Insecurity (04/30/2021)   Hunger Vital Sign    Worried About Running Out of Food in the Last Year: Never true    Ran Out of Food in the Last Year:  Never true  Transportation Needs: No Transportation Needs (04/30/2021)   PRAPARE - Hydrologist (Medical): No    Lack of Transportation (Non-Medical): No  Physical Activity: Not on file  Stress: Stress Concern Present (04/30/2021)   [place]    Feeling of Stress : To some extent  Social Connections: Moderately Isolated (04/30/2021)   Social Connection and Isolation Panel [NHANES]    Frequency of Communication with Friends and Family: More than three times a week    Frequency of Social Gatherings with Friends and Family: More than three times a week    Attends Religious Services: Never    Marine scientist or Organizations: No    Attends Archivist Meetings: Never    Marital Status: Married  Human resources officer Violence: Not on file    FAMILY HISTORY: No family history on file.  ALLERGIES:  is allergic to prednisone, dilaudid [hydromorphone hcl], percocet [oxycodone-acetaminophen], contrast media [iodinated contrast media], and tape.  MEDICATIONS:  Current Outpatient Medications  Medication Sig Dispense Refill   ALPRAZolam (XANAX) 0.5 MG tablet Take 0.5 mg by mouth 2 (two) times daily.     atorvastatin (LIPITOR) 20 MG tablet Take 20 mg by mouth daily.     CALCIUM PO Take 600 mg by mouth daily.     desipramine (NORPRAMIN) 50 MG tablet Take 50 mg by mouth daily.     ibuprofen (ADVIL) 200 MG tablet Take 200 mg by mouth every 6 (six) hours as needed for headache or moderate pain.     Multiple Vitamins-Minerals (MULTIVITAMINS THER. W/MINERALS) TABS Take 1 tablet by mouth daily.     sertraline (ZOLOFT) 100 MG tablet Take 200 mg by mouth daily.     vitamin C (ASCORBIC ACID) 500 MG tablet Take 1,000 mg by mouth daily.     No current facility-administered medications for this visit.    REVIEW OF SYSTEMS:   Constitutional: ( - ) fevers, ( - )  chills , ( - ) night sweats Eyes: ( - )  blurriness of vision, ( - ) double vision, ( - ) watery eyes Ears, nose, mouth, throat, and face: ( - ) mucositis, ( - ) sore throat Respiratory: ( - ) cough, ( - ) dyspnea, ( - ) wheezes Cardiovascular: ( - ) palpitation, ( - ) chest discomfort, ( - ) lower extremity swelling Gastrointestinal:  ( - ) nausea, ( - ) heartburn, ( - ) change in bowel habits Skin: ( - ) abnormal skin rashes Lymphatics: ( - ) new lymphadenopathy, ( - ) easy bruising Neurological: ( - ) numbness, ( - ) tingling, ( - ) new weaknesses Behavioral/Psych: ( - ) mood change, ( - ) new changes  All other systems were reviewed with the patient and are negative.  PHYSICAL EXAMINATION: ECOG PERFORMANCE STATUS: 0 - Asymptomatic  Vitals:   07/30/22 1018  BP: 122/67  Pulse: (!) 102  Resp: 20  Temp:  97.8 F (36.6 C)  SpO2: 100%   Filed Weights   07/30/22 1018  Weight: 255 lb 6.4 oz (115.8 kg)    GENERAL: Well-appearing elderly Caucasian female, alert, no distress and comfortable SKIN: skin color, texture, turgor are normal, no rashes EYES: conjunctiva are pink and non-injected, sclera clear LUNGS: clear to auscultation and percussion with normal breathing effort HEART: regular rate & rhythm and no murmurs and no lower extremity edema Musculoskeletal: no cyanosis of digits and no clubbing  PSYCH: alert & oriented x 3, fluent speech NEURO: no focal motor/sensory deficits  LABORATORY DATA:  I have reviewed the data as listed    Latest Ref Rng & Units 07/30/2022   10:05 AM 04/29/2022    8:08 AM 01/28/2022    8:01 AM  CBC  WBC 4.0 - 10.5 K/uL 7.8  7.1  7.3   Hemoglobin 12.0 - 15.0 g/dL 15.1  14.8  14.3   Hematocrit 36.0 - 46.0 % 43.3  42.5  41.7   Platelets 150 - 400 K/uL 249  239  234        Latest Ref Rng & Units 07/30/2022   10:05 AM 04/29/2022    8:08 AM 01/28/2022    8:01 AM  CMP  Glucose 70 - 99 mg/dL 95  110  84   BUN 8 - 23 mg/dL '15  15  17   '$ Creatinine 0.44 - 1.00 mg/dL 1.07  0.98  0.95    Sodium 135 - 145 mmol/L 140  141  141   Potassium 3.5 - 5.1 mmol/L 4.5  3.9  3.8   Chloride 98 - 111 mmol/L 107  107  106   CO2 22 - 32 mmol/L '30  28  30   '$ Calcium 8.9 - 10.3 mg/dL 9.7  9.1  9.4   Total Protein 6.5 - 8.1 g/dL 7.3  7.2  7.0   Total Bilirubin 0.3 - 1.2 mg/dL 0.3  0.3  0.3   Alkaline Phos 38 - 126 U/L 78  77  72   AST 15 - 41 U/L '16  18  17   '$ ALT 0 - 44 U/L '15  18  17     '$ RADIOGRAPHIC STUDIES: No results found.  ASSESSMENT & PLAN Tina [name] 71 y.o. 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 -- Patient has completed palliative radiation to the lymphomatous mass of the abdomen.  This should be curative therapy --labs today show [place] blood cell count 7.8, hemoglobin 15.1, MCV 94.1, and platelets of 249. --post treatment PET CT scan on 10/01/2021 showed completed response to treatment with residual lesion showing Deauville 2  --No clinical signs of recurrence. Continue with surveillance.  --continue CT scan q 6 months x 2 years, followed by annually x 3 years.  --Plan have the patient return to clinic in 3 months with repeat CT scan.  No orders of the defined types were placed in this encounter.   All questions were answered. The patient knows to call the clinic with any problems, questions or concerns.  I have spent a total of 30 minutes minutes of face-to-face and non-face-to-face time, preparing to see the patient,  performing a medically appropriate examination, counseling and educating the patient, ordering tests/procedures, documenting clinical information in the electronic health record, independently interpreting results and communicating results to the patient, and  care coordination.   Dede Query PA-C Dept of Hematology and [place] at [facility] Phone: [phone]   07/30/2022 11:03 AM

## 2022-08-10 DIAGNOSIS — J329 Chronic sinusitis, unspecified: Secondary | ICD-10-CM | POA: Diagnosis not present

## 2022-08-16 DIAGNOSIS — D1801 Hemangioma of skin and subcutaneous tissue: Secondary | ICD-10-CM | POA: Diagnosis not present

## 2022-08-16 DIAGNOSIS — L821 Other seborrheic keratosis: Secondary | ICD-10-CM | POA: Diagnosis not present

## 2022-08-16 DIAGNOSIS — L570 Actinic keratosis: Secondary | ICD-10-CM | POA: Diagnosis not present

## 2022-08-16 DIAGNOSIS — L814 Other melanin hyperpigmentation: Secondary | ICD-10-CM | POA: Diagnosis not present

## 2022-08-16 DIAGNOSIS — L918 Other hypertrophic disorders of the skin: Secondary | ICD-10-CM | POA: Diagnosis not present

## 2022-08-16 DIAGNOSIS — L813 Cafe au lait spots: Secondary | ICD-10-CM | POA: Diagnosis not present

## 2022-09-13 ENCOUNTER — Telehealth: Payer: Self-pay | Admitting: Hematology and Oncology

## 2022-09-13 NOTE — Telephone Encounter (Signed)
Called patient to r/s January appointment due to provider PAL. Patient notified.

## 2022-10-25 ENCOUNTER — Other Ambulatory Visit: Payer: Self-pay | Admitting: Hematology and Oncology

## 2022-10-25 ENCOUNTER — Inpatient Hospital Stay: Payer: Medicare HMO | Attending: Physician Assistant

## 2022-10-25 ENCOUNTER — Other Ambulatory Visit: Payer: Self-pay

## 2022-10-25 ENCOUNTER — Inpatient Hospital Stay (HOSPITAL_BASED_OUTPATIENT_CLINIC_OR_DEPARTMENT_OTHER): Payer: Medicare HMO | Admitting: Hematology and Oncology

## 2022-10-25 VITALS — BP 137/79 | HR 100 | Temp 98.2°F | Resp 16 | Wt 257.3 lb

## 2022-10-25 DIAGNOSIS — Z90710 Acquired absence of both cervix and uterus: Secondary | ICD-10-CM | POA: Diagnosis not present

## 2022-10-25 DIAGNOSIS — Z91041 Radiographic dye allergy status: Secondary | ICD-10-CM | POA: Insufficient documentation

## 2022-10-25 DIAGNOSIS — Z885 Allergy status to narcotic agent status: Secondary | ICD-10-CM | POA: Diagnosis not present

## 2022-10-25 DIAGNOSIS — Z888 Allergy status to other drugs, medicaments and biological substances status: Secondary | ICD-10-CM | POA: Insufficient documentation

## 2022-10-25 DIAGNOSIS — Z79899 Other long term (current) drug therapy: Secondary | ICD-10-CM | POA: Diagnosis not present

## 2022-10-25 DIAGNOSIS — F17210 Nicotine dependence, cigarettes, uncomplicated: Secondary | ICD-10-CM | POA: Insufficient documentation

## 2022-10-25 DIAGNOSIS — K5900 Constipation, unspecified: Secondary | ICD-10-CM | POA: Diagnosis not present

## 2022-10-25 DIAGNOSIS — C8290 Follicular lymphoma, unspecified, unspecified site: Secondary | ICD-10-CM | POA: Diagnosis not present

## 2022-10-25 DIAGNOSIS — Z923 Personal history of irradiation: Secondary | ICD-10-CM | POA: Diagnosis not present

## 2022-10-25 DIAGNOSIS — Z9049 Acquired absence of other specified parts of digestive tract: Secondary | ICD-10-CM | POA: Insufficient documentation

## 2022-10-25 LAB — CMP (CANCER CENTER ONLY)
ALT: 15 U/L (ref 0–44)
AST: 15 U/L (ref 15–41)
Albumin: 3.8 g/dL (ref 3.5–5.0)
Alkaline Phosphatase: 66 U/L (ref 38–126)
Anion gap: 7 (ref 5–15)
BUN: 15 mg/dL (ref 8–23)
CO2: 29 mmol/L (ref 22–32)
Calcium: 9.3 mg/dL (ref 8.9–10.3)
Chloride: 105 mmol/L (ref 98–111)
Creatinine: 1.04 mg/dL — ABNORMAL HIGH (ref 0.44–1.00)
GFR, Estimated: 58 mL/min — ABNORMAL LOW (ref >60)
Glucose, Bld: 88 mg/dL (ref 70–99)
Potassium: 3.6 mmol/L (ref 3.5–5.1)
Sodium: 141 mmol/L (ref 135–145)
Total Bilirubin: 0.3 mg/dL (ref 0.3–1.2)
Total Protein: 6.7 g/dL (ref 6.5–8.1)

## 2022-10-25 LAB — CBC WITH DIFFERENTIAL (CANCER CENTER ONLY)
Abs Immature Granulocytes: 0.01 10*3/uL (ref 0.00–0.07)
Basophils Absolute: 0 10*3/uL (ref 0.0–0.1)
Basophils Relative: 1 %
Eosinophils Absolute: 0.2 10*3/uL (ref 0.0–0.5)
Eosinophils Relative: 3 %
HCT: 40.8 % (ref 36.0–46.0)
Hemoglobin: 14.2 g/dL (ref 12.0–15.0)
Immature Granulocytes: 0 %
Lymphocytes Relative: 34 %
Lymphs Abs: 2.5 10*3/uL (ref 0.7–4.0)
MCH: 32.5 pg (ref 26.0–34.0)
MCHC: 34.8 g/dL (ref 30.0–36.0)
MCV: 93.4 fL (ref 80.0–100.0)
Monocytes Absolute: 0.6 10*3/uL (ref 0.1–1.0)
Monocytes Relative: 8 %
Neutro Abs: 4.1 10*3/uL (ref 1.7–7.7)
Neutrophils Relative %: 54 %
Platelet Count: 236 10*3/uL (ref 150–400)
RBC: 4.37 MIL/uL (ref 3.87–5.11)
RDW: 13 % (ref 11.5–15.5)
WBC Count: 7.4 10*3/uL (ref 4.0–10.5)
nRBC: 0 % (ref 0.0–0.2)

## 2022-10-25 LAB — LACTATE DEHYDROGENASE: LDH: 109 U/L (ref 98–192)

## 2022-10-25 NOTE — Progress Notes (Signed)
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 04/03/2021: establish care with Dr. Lorenso Courier 06/10/21-07/03/2021: definitive radiation therapy 10/01/2021: PET CT scan showed hypermetabolic soft tissue lesion of the right chest wall is decreased in size and demonstrates decreased hypermetabolic activity. Deauville 2. Consistent with response to therapy.   Interval History:  Tina [name] 72 y.o. 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 The patient's last visit was on 07/30/2022. In the interim since the last visit she has had no major changes in her health.   On exam today Ms. Duling reports she had a good holiday season.  She reports unfortunately in November she became sick with a high fever and bad cough.  She was given antibiotics and diagnosed with a sinus infection.  She reports that the cough comes and goes.  She has spells where she has bouts of coughing.  She reports that she is not having any persistent infectious symptoms such as fevers, chills, sweats.  She also denies any nausea, vomiting, or diarrhea but does have occasional bouts of constipation.  She notes that she has not noticed any lymphadenopathy in her neck or under her arms or groin area.  She reports her weight has been steady and her appetite has been strong.  She has not started any new medications and  had no other major changes in her health. No evidence of recurrent disease.  A full 10 point ROS is listed below.  MEDICAL HISTORY:  Past Medical History:  Diagnosis Date   Anxiety    Bronchitis    hx   COPD (chronic obstructive pulmonary disease) ([place])    smoker 1ppp   Depression    Hyperlipidemia    Smoker    1ppd    SURGICAL HISTORY: Past Surgical History:  Procedure Laterality Date   ABDOMINAL HYSTERECTOMY     BACK SURGERY     BIOPSY OF SKIN SUBCUTANEOUS TISSUE AND/OR MUCOUS MEMBRANE N/A 05/07/2021   Procedure: EXCISIONAL BIOPSY ABDOMINAL WALL MASS;  Surgeon: Dwan Bolt, MD;  Location: [place];  Service: General;  Laterality: N/A;   CARPAL TUNNEL RELEASE     rt   [place] SURGERY  10/04/2010   CHOLECYSTECTOMY     KNEE ARTHROSCOPY     lft   KNEE ARTHROSCOPY Right 06/10/2020   Procedure: RIGHT KNEE ARTHROSCOPY AND DEBRIDEMENT;  Surgeon: Newt Minion, MD;  Location: [place];  Service: Orthopedics;  Laterality: Right;   LUMBAR LAMINECTOMY  09/30/2011   Procedure: MICRODISCECTOMY LUMBAR LAMINECTOMY;  Surgeon: Jessy Oto, MD;  Location: [place];  Service: Orthopedics;  Laterality: Right;  Right L4-5 Microdiscectomy using MIS approach    SOCIAL HISTORY: Social History   Socioeconomic History   Marital status: Married    Spouse name: Not on file   Number of children: Not on file   Years of education: Not on file   Highest education level: Not on file  Occupational History   Not on file  Tobacco Use  Smoking status: Every Day    Packs/day: 1.00    Types: Cigarettes   Smokeless tobacco: Never  Substance and Sexual Activity   Alcohol use: No   Drug use: No   Sexual activity: Not on file    Comment: hysterectomy  Other Topics Concern   Not on file  Social History Narrative   Not on file   Social Determinants of Health   Financial Resource Strain: Low Risk  (04/30/2021)   Overall Financial Resource Strain (CARDIA)    Difficulty of  Paying Living Expenses: Not very hard  Food Insecurity: No Food Insecurity (04/30/2021)   Hunger Vital Sign    Worried About Running Out of Food in the Last Year: Never true    Ran Out of Food in the Last Year: Never true  Transportation Needs: No Transportation Needs (04/30/2021)   PRAPARE - Hydrologist (Medical): No    Lack of Transportation (Non-Medical): No  Physical Activity: Not on file  Stress: Stress Concern Present (04/30/2021)   [place]    Feeling of Stress : To some extent  Social Connections: Moderately Isolated (04/30/2021)   Social Connection and Isolation Panel [NHANES]    Frequency of Communication with Friends and Family: More than three times a week    Frequency of Social Gatherings with Friends and Family: More than three times a week    Attends Religious Services: Never    Marine scientist or Organizations: No    Attends Archivist Meetings: Never    Marital Status: Married  Human resources officer Violence: Not on file    FAMILY HISTORY: No family history on file.  ALLERGIES:  is allergic to prednisone, dilaudid [hydromorphone hcl], percocet [oxycodone-acetaminophen], contrast media [iodinated contrast media], and tape.  MEDICATIONS:  Current Outpatient Medications  Medication Sig Dispense Refill   ALPRAZolam (XANAX) 0.5 MG tablet Take 0.5 mg by mouth 2 (two) times daily.     atorvastatin (LIPITOR) 20 MG tablet Take 20 mg by mouth daily.     CALCIUM PO Take 600 mg by mouth daily.     desipramine (NORPRAMIN) 50 MG tablet Take 50 mg by mouth daily.     ibuprofen (ADVIL) 200 MG tablet Take 200 mg by mouth every 6 (six) hours as needed for headache or moderate pain.     Multiple Vitamins-Minerals (MULTIVITAMINS THER. W/MINERALS) TABS Take 1 tablet by mouth daily.     sertraline (ZOLOFT) 100 MG tablet Take 200 mg by mouth daily.     vitamin C (ASCORBIC ACID)  500 MG tablet Take 1,000 mg by mouth daily.     No current facility-administered medications for this visit.    REVIEW OF SYSTEMS:   Constitutional: ( - ) fevers, ( - )  chills , ( - ) night sweats Eyes: ( - ) blurriness of vision, ( - ) double vision, ( - ) watery eyes Ears, nose, mouth, throat, and face: ( - ) mucositis, ( - ) sore throat Respiratory: ( - ) cough, ( - ) dyspnea, ( - ) wheezes Cardiovascular: ( - ) palpitation, ( - ) chest discomfort, ( - ) lower extremity swelling Gastrointestinal:  ( - ) nausea, ( - ) heartburn, ( - ) change in bowel habits Skin: ( - ) abnormal skin rashes Lymphatics: ( - ) new lymphadenopathy, ( - ) easy bruising Neurological: ( - ) numbness, ( - ) tingling, ( - )  new weaknesses Behavioral/Psych: ( - ) mood change, ( - ) new changes  All other systems were reviewed with the patient and are negative.  PHYSICAL EXAMINATION: ECOG PERFORMANCE STATUS: 0 - Asymptomatic  Vitals:   10/25/22 0832 10/25/22 0836  BP: 137/79   Pulse: (!) 108 100  Resp: 16   Temp: 98.2 F (36.8 C)   SpO2: 97%    Filed Weights   10/25/22 0832  Weight: 257 lb 4.8 oz (116.7 kg)    GENERAL: Well-appearing elderly Caucasian female, alert, no distress and comfortable SKIN: skin color, texture, turgor are normal, no rashes EYES: conjunctiva are pink and non-injected, sclera clear LUNGS: clear to auscultation and percussion with normal breathing effort HEART: regular rate & rhythm and no murmurs and no lower extremity edema Musculoskeletal: no cyanosis of digits and no clubbing  PSYCH: alert & oriented x 3, fluent speech NEURO: no focal motor/sensory deficits  LABORATORY DATA:  I have reviewed the data as listed    Latest Ref Rng & Units 10/25/2022    8:11 AM 07/30/2022   10:05 AM 04/29/2022    8:08 AM  CBC  WBC 4.0 - 10.5 K/uL 7.4  7.8  7.1   Hemoglobin 12.0 - 15.0 g/dL 14.2  15.1  14.8   Hematocrit 36.0 - 46.0 % 40.8  43.3  42.5   Platelets 150 - 400 K/uL 236   249  239        Latest Ref Rng & Units 07/30/2022   10:05 AM 04/29/2022    8:08 AM 01/28/2022    8:01 AM  CMP  Glucose 70 - 99 mg/dL 95  110  84   BUN 8 - 23 mg/dL '15  15  17   '$ Creatinine 0.44 - 1.00 mg/dL 1.07  0.98  0.95   Sodium 135 - 145 mmol/L 140  141  141   Potassium 3.5 - 5.1 mmol/L 4.5  3.9  3.8   Chloride 98 - 111 mmol/L 107  107  106   CO2 22 - 32 mmol/L '30  28  30   '$ Calcium 8.9 - 10.3 mg/dL 9.7  9.1  9.4   Total Protein 6.5 - 8.1 g/dL 7.3  7.2  7.0   Total Bilirubin 0.3 - 1.2 mg/dL 0.3  0.3  0.3   Alkaline Phos 38 - 126 U/L 78  77  72   AST 15 - 41 U/L '16  18  17   '$ ALT 0 - 44 U/L '15  18  17     '$ RADIOGRAPHIC STUDIES: No results found.  ASSESSMENT & PLAN Tina [name] 72 y.o. 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 -- Patient has completed definitive radiation to the lymphomatous mass of the abdomen.  This should be curative therapy --labs today show [place] blood cell count 7.4, hemoglobin 14.2, MCV 93.4, and platelets of 236. --post treatment PET CT scan on 10/01/2021 showed completed response to treatment with residual lesion showing Deauville 2  --No clinical signs of recurrence. Continue with surveillance.  --continue CT scan q 6 months x 2 years, followed by annually x 3 years. Currently due for CT scan.  --Plan have the patient return to clinic in 6 months.  No orders of the defined types were placed in this encounter.   All questions were answered. The patient knows to  call the clinic with any problems, questions or concerns.  I have spent a total of 30 minutes minutes of face-to-face and non-face-to-face time, preparing to see the patient,  performing a medically appropriate examination, counseling and educating the patient, ordering tests/procedures, documenting  clinical information in the electronic health record, independently interpreting results and communicating results to the patient, and care coordination.   Ledell Peoples, MD Department of Hematology/Oncology [place] at [facility] Phone: [phone] Pager: [phone] Email: [email or web address]  10/25/2022 8:40 AM

## 2022-10-28 ENCOUNTER — Ambulatory Visit: Payer: Medicare HMO | Admitting: Hematology and Oncology

## 2022-10-28 ENCOUNTER — Other Ambulatory Visit: Payer: Medicare HMO

## 2022-11-02 ENCOUNTER — Ambulatory Visit (HOSPITAL_COMMUNITY)
Admission: RE | Admit: 2022-11-02 | Discharge: 2022-11-02 | Disposition: A | Discharge status: Home / Self Care | Payer: Medicare HMO | Source: Ambulatory Visit | Attending: Physician Assistant | Admitting: Physician Assistant

## 2022-11-02 DIAGNOSIS — I358 Other nonrheumatic aortic valve disorders: Secondary | ICD-10-CM | POA: Diagnosis not present

## 2022-11-02 DIAGNOSIS — Z8572 Personal history of non-Hodgkin lymphomas: Secondary | ICD-10-CM | POA: Diagnosis not present

## 2022-11-02 DIAGNOSIS — J439 Emphysema, unspecified: Secondary | ICD-10-CM | POA: Diagnosis not present

## 2022-11-02 DIAGNOSIS — C8290 Follicular lymphoma, unspecified, unspecified site: Secondary | ICD-10-CM | POA: Diagnosis not present

## 2022-11-02 DIAGNOSIS — D171 Benign lipomatous neoplasm of skin and subcutaneous tissue of trunk: Secondary | ICD-10-CM | POA: Diagnosis not present

## 2022-11-02 DIAGNOSIS — R590 Localized enlarged lymph nodes: Secondary | ICD-10-CM | POA: Diagnosis not present

## 2022-11-02 DIAGNOSIS — K5730 Diverticulosis of large intestine without perforation or abscess without bleeding: Secondary | ICD-10-CM | POA: Diagnosis not present

## 2022-11-04 DIAGNOSIS — J309 Allergic rhinitis, unspecified: Secondary | ICD-10-CM | POA: Diagnosis not present

## 2022-11-04 DIAGNOSIS — F17200 Nicotine dependence, unspecified, uncomplicated: Secondary | ICD-10-CM | POA: Diagnosis not present

## 2022-11-04 DIAGNOSIS — J42 Unspecified chronic bronchitis: Secondary | ICD-10-CM | POA: Diagnosis not present

## 2022-11-08 ENCOUNTER — Telehealth: Payer: Self-pay

## 2022-11-08 NOTE — Telephone Encounter (Addendum)
Called and spoke with patient to advise of message below.   ----- Message from Orson Slick, MD sent at 11/08/2022  3:55 PM EST ----- Please let Mrs. Spellman know that her CT scan showed no evidence of residual or recurrent disease. We will continue to monitor her with a clinic visit in July 2024.   ----- Message ----- From: Cordelia Poche Sent: 11/08/2022   3:53 PM EST To: Orson Slick, MD  FYI.

## 2022-12-15 DIAGNOSIS — C8590 Non-Hodgkin lymphoma, unspecified, unspecified site: Secondary | ICD-10-CM | POA: Diagnosis not present

## 2022-12-15 DIAGNOSIS — Z23 Encounter for immunization: Secondary | ICD-10-CM | POA: Diagnosis not present

## 2022-12-15 DIAGNOSIS — Z6841 Body Mass Index (BMI) 40.0 and over, adult: Secondary | ICD-10-CM | POA: Diagnosis not present

## 2022-12-15 DIAGNOSIS — R7303 Prediabetes: Secondary | ICD-10-CM | POA: Diagnosis not present

## 2022-12-15 DIAGNOSIS — E785 Hyperlipidemia, unspecified: Secondary | ICD-10-CM | POA: Diagnosis not present

## 2022-12-15 DIAGNOSIS — I700 Atherosclerosis of aorta: Secondary | ICD-10-CM | POA: Diagnosis not present

## 2022-12-15 DIAGNOSIS — F3342 Major depressive disorder, recurrent, in full remission: Secondary | ICD-10-CM | POA: Diagnosis not present

## 2022-12-15 DIAGNOSIS — Z0000 Encounter for general adult medical examination without abnormal findings: Secondary | ICD-10-CM | POA: Diagnosis not present

## 2022-12-15 DIAGNOSIS — F17200 Nicotine dependence, unspecified, uncomplicated: Secondary | ICD-10-CM | POA: Diagnosis not present

## 2023-01-18 DIAGNOSIS — Z135 Encounter for screening for eye and ear disorders: Secondary | ICD-10-CM | POA: Diagnosis not present

## 2023-01-18 DIAGNOSIS — H2513 Age-related nuclear cataract, bilateral: Secondary | ICD-10-CM | POA: Diagnosis not present

## 2023-01-18 DIAGNOSIS — H524 Presbyopia: Secondary | ICD-10-CM | POA: Diagnosis not present

## 2023-03-07 IMAGING — MR MR CHEST MEDIASTINUM W/O CM
6 series · 16 of 16 positions shown · non-contrast
Comparison: Ultrasound 03/03/2021

CLINICAL DATA: Mass on right breast area

EXAM:
MR CHEST WITHOUT CONTRAST
TECHNIQUE: Multiplanar, multisequence MR imaging of the chest was performed. No
intravenous contrast was administered.

[Series 4: T1 · axial · left · 4.0mm · 0.56mm/px · z∈[-69,+36]mm · 2 of 23 slices shown (1 of 2)]
[im 1/23]
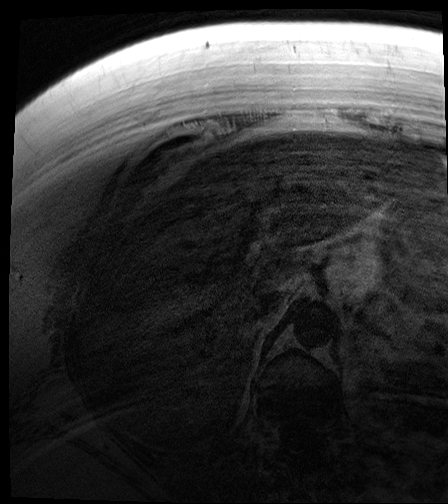
[im 23/23]
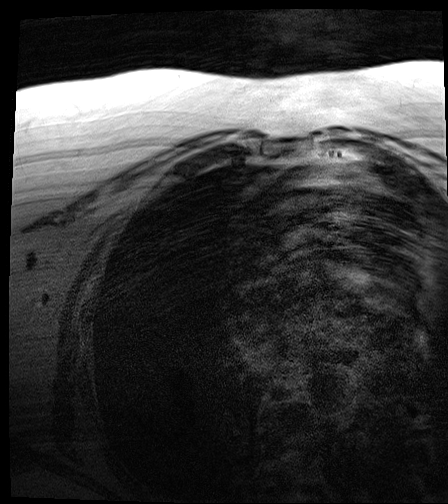

[Series 5: T2 fat-sat · axial · left · 4.0mm · 0.56mm/px · z∈[-69,+36]mm · 2 of 23 slices shown (1 of 3)]
[im 1/23]
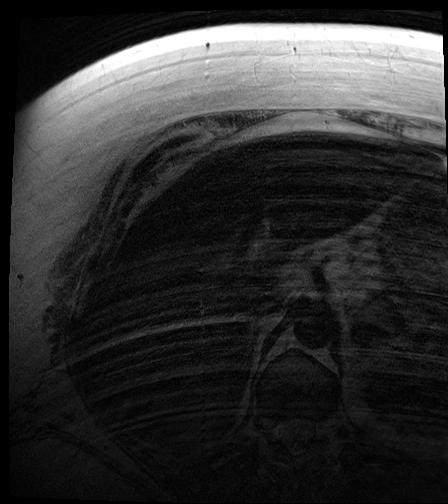
[im 23/23]
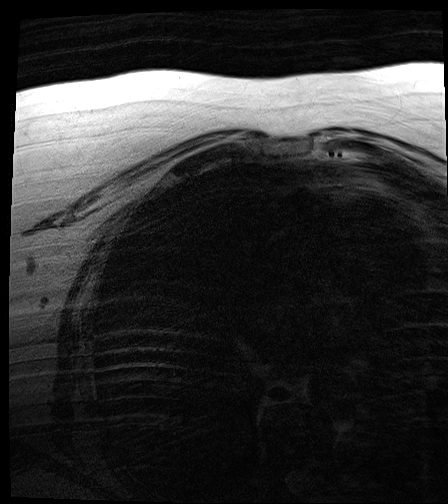

[Series 6: T1 · coronal · left · 4.0mm · 0.78mm/px · 3 of 23 slices shown (2 of 2)]
[im 1/23]
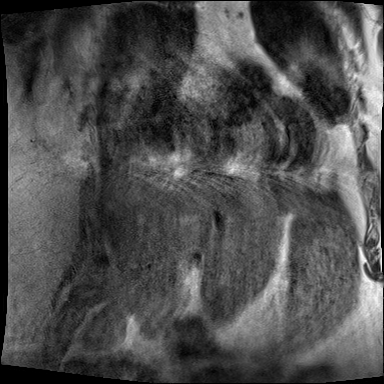
[im 12/23]
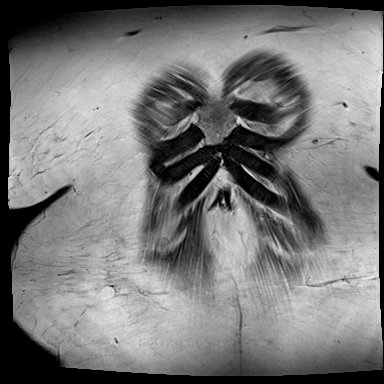
[im 23/23]
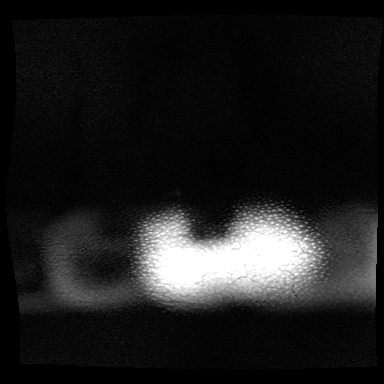

[Series 7: T2 fat-sat · coronal · left · 4.0mm · 0.78mm/px · 3 of 23 slices shown (2 of 3)]
[im 1/23]
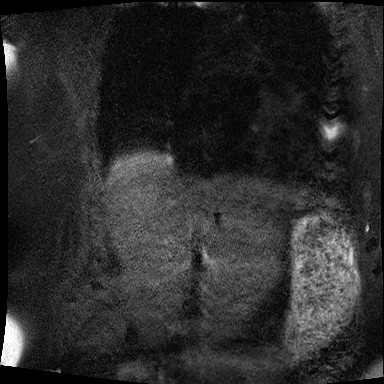
[im 12/23]
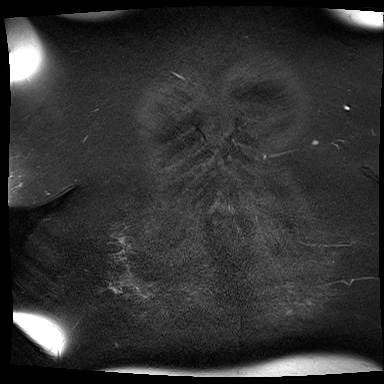
[im 23/23]
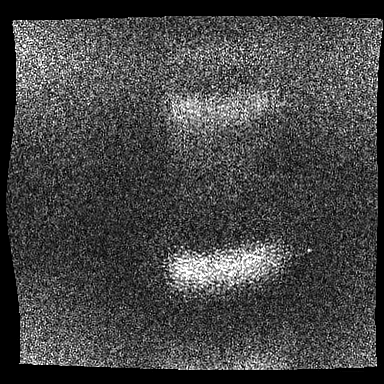

[Series 8: STIR · sagittal · left · 4.0mm · 0.94mm/px · 3 of 27 slices shown]
[im 1/27]
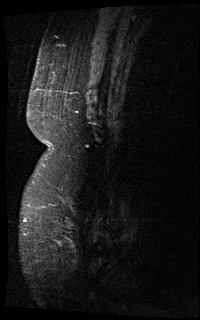
[im 14/27]
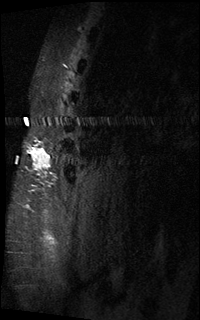
[im 27/27]
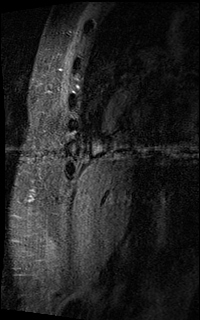

[Series 9: T2 fat-sat · axial · left · 4.0mm · 0.56mm/px · z∈[-69,+36]mm · 3 of 23 slices shown (3 of 3)]
[im 1/23]
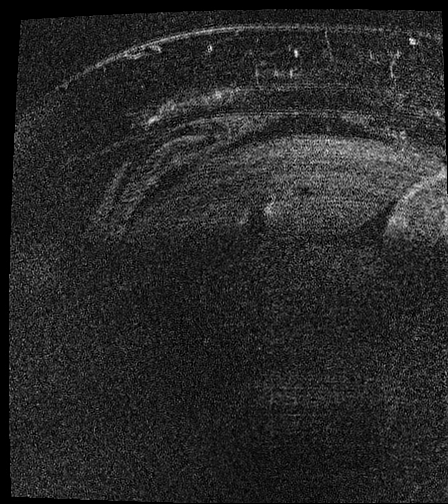
[im 12/23]
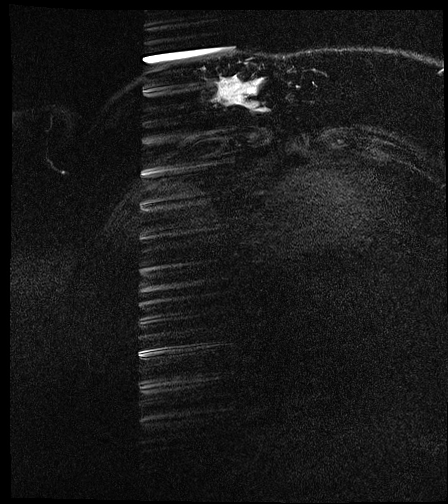
[im 23/23]
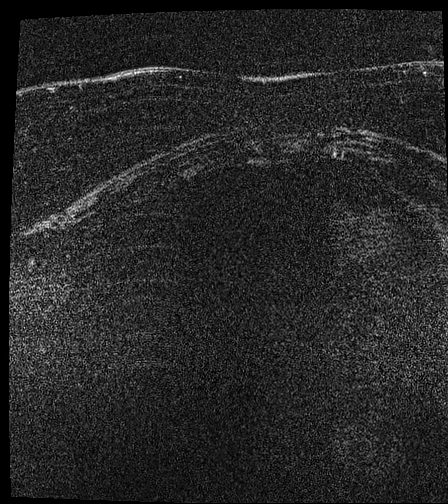

[16 of 16 positions shown; findings below may reference images not displayed]

FINDINGS: Bones/Joint/Cartilage:

The cortex is intact. There is no significant marrow signal
alteration.

Ligaments:

Grossly intact.

Muscles and Tendons:

Generalized muscle atrophy.

Soft tissue:

Within the palpable area of concern along the right lower anterior
chest, there is a subcutaneous T2 hyperintense, T1 hypointense mass
with angulated margins measuring 4.3 x 2.6 x 1.7 cm (T2 fat-sat
coronal image 7, axial image 11.)
IMPRESSION: Subcutaneous, T2 hyperintense/T1 hypointense mass with angulated
margins, measuring 4.3 x 2.6 x 1.7 cm, corresponding to the palpable
area of concern in the anterior chest. Imaging characteristics are
nonspecific on this noncontrast exam, but concerning for neoplasm.
Recommend ultrasound-guided biopsy for definitive tissue
characterization.

## 2023-03-14 ENCOUNTER — Ambulatory Visit: Payer: Medicare HMO | Admitting: Orthopedic Surgery

## 2023-03-14 DIAGNOSIS — G8929 Other chronic pain: Secondary | ICD-10-CM | POA: Diagnosis not present

## 2023-03-14 DIAGNOSIS — M25561 Pain in right knee: Secondary | ICD-10-CM | POA: Diagnosis not present

## 2023-03-29 ENCOUNTER — Encounter: Payer: Self-pay | Admitting: Orthopedic Surgery

## 2023-03-29 DIAGNOSIS — G8929 Other chronic pain: Secondary | ICD-10-CM | POA: Diagnosis not present

## 2023-03-29 DIAGNOSIS — M25561 Pain in right knee: Secondary | ICD-10-CM

## 2023-03-29 MED ORDER — LIDOCAINE HCL (PF) 1 % IJ SOLN
5.0000 mL | INTRAMUSCULAR | Status: AC | PRN
Start: 2023-03-29 — End: 2023-03-29
  Administered 2023-03-29: 5 mL

## 2023-03-29 MED ORDER — METHYLPREDNISOLONE ACETATE 40 MG/ML IJ SUSP
40.0000 mg | INTRAMUSCULAR | Status: AC | PRN
Start: 2023-03-29 — End: 2023-03-29
  Administered 2023-03-29: 40 mg via INTRA_ARTICULAR

## 2023-03-29 NOTE — Progress Notes (Signed)
Office Visit Note   Patient: Tina [name]           Date of Birth: June 17, 1951           MRN: [id number] Visit Date: 03/14/2023              Requested by: Merri Brunette, MD [phone] [address] [place],  [place] [address] PCP: Merri Brunette, MD  Chief Complaint  Patient presents with   Right Knee - Pain    Requesting cortisone injection      HPI: Patient is a 72 year old woman who presents with osteoarthritis right knee.  Patient states she has trouble climbing stairs her last injection in the right knee was December 2022.  Assessment & Plan: Visit Diagnoses:  1. Chronic pain of right knee     Plan: Right knee was injected she tolerated this well recommended compression for her varicose veins.  Follow-Up Instructions: No follow-ups on file.   Ortho Exam  Patient is alert, oriented, no adenopathy, well-dressed She does have a mild effusion s.  No cellulitis.  He does have large varicose veins in her lower extremity, normal affect, normal respiratory effort. Examination patient has crepitation with range of motion of the right knee  Imaging: No results found. No images are attached to the encounter.  Labs: Lab Results  Component Value Date   LABURIC 5.8 04/03/2021   LABURIC 5.7 07/30/2020     Lab Results  Component Value Date   ALBUMIN 3.8 10/25/2022   ALBUMIN 4.0 07/30/2022   ALBUMIN 4.0 04/29/2022    No results found for: "MG" No results found for: "VD25OH"  No results found for: "PREALBUMIN"    Latest Ref Rng & Units 10/25/2022    8:11 AM 07/30/2022   10:05 AM 04/29/2022    8:08 AM  CBC EXTENDED  WBC 4.0 - 10.5 K/uL 7.4  7.8  7.1   RBC 3.87 - 5.11 MIL/uL 4.37  4.60  4.59   Hemoglobin 12.0 - 15.0 g/dL 41.3  24.4  01.0   HCT 36.0 - 46.0 % 40.8  43.3  42.5   Platelets 150 - 400 K/uL 236  249  239   NEUT# 1.7 - 7.7 K/uL 4.1  4.3  4.1   Lymph# 0.7 - 4.0 K/uL 2.5  2.6  2.2      There is no height or weight on file to calculate  BMI.  Orders:  No orders of the defined types were placed in this encounter.  No orders of the defined types were placed in this encounter.    Procedures: Large Joint Inj: R knee on 03/29/2023 12:11 PM Indications: pain and diagnostic evaluation Details: 22 G 1.5 in needle, anteromedial approach  Arthrogram: No  Medications: 5 mL lidocaine (PF) 1 %; 40 mg methylPREDNISolone acetate 40 MG/ML Outcome: tolerated well, no immediate complications Procedure, treatment alternatives, risks and benefits explained, specific risks discussed. Consent was given by the patient. Immediately prior to procedure a time out was called to verify the correct patient, procedure, equipment, support staff and site/side marked as required. Patient was prepped and draped in the usual sterile fashion.      Clinical Data: No additional findings.  ROS:  All other systems negative, except as noted in the HPI. Review of Systems  Objective: Vital Signs: There were no vitals taken for this visit.  Specialty Comments:  No specialty comments available.  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 No pertinent family history.  Past Surgical History:  Procedure Laterality Date   ABDOMINAL HYSTERECTOMY     BACK SURGERY     BIOPSY OF SKIN SUBCUTANEOUS TISSUE AND/OR MUCOUS MEMBRANE N/A 05/07/2021   Procedure: EXCISIONAL BIOPSY ABDOMINAL WALL MASS;  Surgeon: Fritzi Mandes, MD;  Location[facility] OR;  Service: General;  Laterality: N/A;   CARPAL TUNNEL RELEASE     rt   CERVICAL DISC SURGERY  10/04/2010    CHOLECYSTECTOMY     KNEE ARTHROSCOPY     lft   KNEE ARTHROSCOPY Right 06/10/2020   Procedure: RIGHT KNEE ARTHROSCOPY AND DEBRIDEMENT;  Surgeon: Nadara Mustard, MD;  Location: [facility];  Service: Orthopedics;  Laterality: Right;   LUMBAR LAMINECTOMY  09/30/2011   Procedure: MICRODISCECTOMY LUMBAR LAMINECTOMY;  Surgeon: Kerrin Champagne, MD;  Location: [facility] OR;  Service: Orthopedics;  Laterality: Right;  Right L4-5 Microdiscectomy using MIS approach   Social History   Occupational History   Not on file  Tobacco Use   Smoking status: Every Day    Packs/day: 1    Types: Cigarettes   Smokeless tobacco: Never  Substance and Sexual Activity   Alcohol use: No   Drug use: No   Sexual activity: Not on file    Comment: hysterectomy

## 2023-03-30 ENCOUNTER — Encounter: Payer: Self-pay | Admitting: Hematology and Oncology

## 2023-03-31 ENCOUNTER — Other Ambulatory Visit: Payer: Self-pay | Admitting: Hematology and Oncology

## 2023-03-31 DIAGNOSIS — C8290 Follicular lymphoma, unspecified, unspecified site: Secondary | ICD-10-CM

## 2023-04-19 DIAGNOSIS — R92323 Mammographic fibroglandular density, bilateral breasts: Secondary | ICD-10-CM | POA: Diagnosis not present

## 2023-04-19 DIAGNOSIS — M85852 Other specified disorders of bone density and structure, left thigh: Secondary | ICD-10-CM | POA: Diagnosis not present

## 2023-04-19 DIAGNOSIS — M8589 Other specified disorders of bone density and structure, multiple sites: Secondary | ICD-10-CM | POA: Diagnosis not present

## 2023-04-19 DIAGNOSIS — Z1231 Encounter for screening mammogram for malignant neoplasm of breast: Secondary | ICD-10-CM | POA: Diagnosis not present

## 2023-04-20 ENCOUNTER — Encounter (HOSPITAL_COMMUNITY): Payer: Self-pay

## 2023-04-20 ENCOUNTER — Ambulatory Visit (HOSPITAL_COMMUNITY)
Admission: RE | Admit: 2023-04-20 | Discharge: 2023-04-20 | Disposition: A | Discharge status: Home / Self Care | Payer: Medicare HMO | Source: Ambulatory Visit | Attending: Hematology and Oncology | Admitting: Hematology and Oncology

## 2023-04-20 DIAGNOSIS — C8290 Follicular lymphoma, unspecified, unspecified site: Secondary | ICD-10-CM | POA: Insufficient documentation

## 2023-04-20 DIAGNOSIS — I2510 Atherosclerotic heart disease of native coronary artery without angina pectoris: Secondary | ICD-10-CM | POA: Diagnosis not present

## 2023-04-20 DIAGNOSIS — K5730 Diverticulosis of large intestine without perforation or abscess without bleeding: Secondary | ICD-10-CM | POA: Diagnosis not present

## 2023-04-21 ENCOUNTER — Telehealth: Payer: Self-pay | Admitting: *Deleted

## 2023-04-21 NOTE — Telephone Encounter (Signed)
TCT patient regarding recent scan results. Spoke with patient and advised  that her CT scan shows no evidence of residual or recurrent lymphoma. Will plan to see her next week as scheduled. Pt relieved and voiced understanding. She is aware of her appt on 04/25/23

## 2023-04-21 NOTE — Telephone Encounter (Signed)
-----   Message from Tina [name] sent at 04/21/2023  9:13 AM EDT ----- Please let Ms. Nealy know that her CT scan shows no evidence of residual or recurrent lymphoma.  Will plan to see her next week as scheduled. ----- Message ----- From: Interface, Rad Results In Sent: 04/20/2023   4:18 PM EDT To: Jaci Standard, MD

## 2023-04-24 ENCOUNTER — Other Ambulatory Visit: Payer: Self-pay | Admitting: Hematology and Oncology

## 2023-04-24 DIAGNOSIS — C8290 Follicular lymphoma, unspecified, unspecified site: Secondary | ICD-10-CM

## 2023-04-24 NOTE — Progress Notes (Unsigned)
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 04/03/2021: establish care with Dr. Leonides Schanz 06/10/21-07/03/2021: definitive radiation therapy 10/01/2021: PET CT scan showed hypermetabolic soft tissue lesion of the right chest wall is decreased in size and demonstrates decreased hypermetabolic activity. Deauville 2. Consistent with response to therapy.   Interval History:  Tina [name] 72 y.o. 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 The patient's last visit was on 10/25/2022. In the interim since the last visit she has had no major changes in her health.   On exam today Ms. Galindo reports she has been well overall in the interim since her last visit 6 months ago.  She notes that she is started on no new medications and had no ED visits or hospitalizations.  Her energy is so-so, but today is good at 9 out of 10.  Her appetite is strong and her weight is stable.  She reports that she does have some occasional sinus issues feeling stuffy, but no overt infectious symptoms such as fevers, chills, sweats.  She denies any bumps or lumps concerning for lymphadenopathy.  Overall her health has been steady.  Of note she had difficulty with the p.o. contrast material for her last CT scan which resulted in "the squirts".  A full 10 point ROS is listed below.  MEDICAL HISTORY:  Past  Medical History:  Diagnosis Date   Anxiety    Bronchitis    hx   COPD (chronic obstructive pulmonary disease) (HCC)    smoker 1ppp   Depression    Hyperlipidemia    Smoker    1ppd    SURGICAL HISTORY: Past Surgical History:  Procedure Laterality Date   ABDOMINAL HYSTERECTOMY     BACK SURGERY     BIOPSY OF SKIN SUBCUTANEOUS TISSUE AND/OR MUCOUS MEMBRANE N/A 05/07/2021   Procedure: EXCISIONAL BIOPSY ABDOMINAL WALL MASS;  Surgeon: Fritzi Mandes, MD;  Location: [facility] OR;  Service: General;  Laterality: N/A;   CARPAL TUNNEL RELEASE     rt   CERVICAL DISC SURGERY  10/04/2010   CHOLECYSTECTOMY     KNEE ARTHROSCOPY     lft   KNEE ARTHROSCOPY Right 06/10/2020   Procedure: RIGHT KNEE ARTHROSCOPY AND DEBRIDEMENT;  Surgeon: Nadara Mustard, MD;  Location: [facility];  Service: Orthopedics;  Laterality: Right;   LUMBAR LAMINECTOMY  09/30/2011   Procedure: MICRODISCECTOMY LUMBAR LAMINECTOMY;  Surgeon: Kerrin Champagne, MD;  Location: [facility] OR;  Service: Orthopedics;  Laterality: Right;  Right L4-5 Microdiscectomy using MIS approach    SOCIAL HISTORY: Social History   Socioeconomic History   Marital status: Married    Spouse name: Not on file   Number of children: Not on file   Years of education: Not on file   Highest education level: Not on file  Occupational History   Not on file  Tobacco Use   Smoking status: Every Day    Current packs/day: 1.00    Types: Cigarettes   Smokeless tobacco: Never  Substance  and Sexual Activity   Alcohol use: No   Drug use: No   Sexual activity: Not on file    Comment: hysterectomy  Other Topics Concern   Not on file  Social History Narrative   Not on file   Social Determinants of Health   Financial Resource Strain: Low Risk  (04/30/2021)   Overall Financial Resource Strain (CARDIA)    Difficulty of Paying Living Expenses: Not very hard  Food Insecurity: No Food Insecurity (04/30/2021)   Hunger Vital Sign    Worried About Running  Out of Food in the Last Year: Never true    Ran Out of Food in the Last Year: Never true  Transportation Needs: No Transportation Needs (04/30/2021)   PRAPARE - Administrator, Civil Service (Medical): No    Lack of Transportation (Non-Medical): No  Physical Activity: Not on file  Stress: Stress Concern Present (04/30/2021)   [employer] of Occupational Health - Occupational Stress Questionnaire    Feeling of Stress : To some extent  Social Connections: Unknown (04/01/2022)   Received from [facility], [facility]   Social Network    Social Network: Not on file  Intimate Partner Violence: Unknown (04/01/2022)   Received from [facility], [facility]   HITS    Physically Hurt: Not on file    Insult or Talk Down To: Not on file    Threaten Physical Harm: Not on file    Scream or Curse: Not on file    FAMILY HISTORY: No family history on file.  ALLERGIES:  is allergic to prednisone, dilaudid [hydromorphone hcl], percocet [oxycodone-acetaminophen], contrast media [iodinated contrast media], and tape.  MEDICATIONS:  Current Outpatient Medications  Medication Sig Dispense Refill   ALPRAZolam (XANAX) 0.5 MG tablet Take 0.5 mg by mouth 2 (two) times daily.     atorvastatin (LIPITOR) 20 MG tablet Take 20 mg by mouth daily.     CALCIUM PO Take 600 mg by mouth daily.     desipramine (NORPRAMIN) 50 MG tablet Take 50 mg by mouth daily.     ibuprofen (ADVIL) 200 MG tablet Take 200 mg by mouth every 6 (six) hours as needed for headache or moderate pain.     Multiple Vitamins-Minerals (MULTIVITAMINS THER. W/MINERALS) TABS Take 1 tablet by mouth daily.     sertraline (ZOLOFT) 100 MG tablet Take 200 mg by mouth daily.     vitamin C (ASCORBIC ACID) 500 MG tablet Take 1,000 mg by mouth daily.     No current facility-administered medications for this visit.    REVIEW OF SYSTEMS:   Constitutional: ( - ) fevers, ( - )  chills , ( - ) night sweats Eyes: ( - ) blurriness of  vision, ( - ) double vision, ( - ) watery eyes Ears, nose, mouth, throat, and face: ( - ) mucositis, ( - ) sore throat Respiratory: ( - ) cough, ( - ) dyspnea, ( - ) wheezes Cardiovascular: ( - ) palpitation, ( - ) chest discomfort, ( - ) lower extremity swelling Gastrointestinal:  ( - ) nausea, ( - ) heartburn, ( - ) change in bowel habits Skin: ( - ) abnormal skin rashes Lymphatics: ( - ) new lymphadenopathy, ( - ) easy bruising Neurological: ( - ) numbness, ( - ) tingling, ( - ) new weaknesses Behavioral/Psych: ( - ) mood change, ( - ) new changes  All other systems were reviewed with the patient and are negative.  PHYSICAL EXAMINATION:  ECOG PERFORMANCE STATUS: 0 - Asymptomatic  Vitals:   04/25/23 1014  BP: 134/68  Pulse: (!) 106  Resp: 17  Temp: (!) 97.4 F (36.3 C)  SpO2: 98%    Filed Weights   04/25/23 1014  Weight: 253 lb 4.8 oz (114.9 kg)     GENERAL: Well-appearing elderly Caucasian female, alert, no distress and comfortable SKIN: skin color, texture, turgor are normal, no rashes EYES: conjunctiva are pink and non-injected, sclera clear LUNGS: clear to auscultation and percussion with normal breathing effort HEART: regular rate & rhythm and no murmurs and no lower extremity edema Musculoskeletal: no cyanosis of digits and no clubbing  PSYCH: alert & oriented x 3, fluent speech NEURO: no focal motor/sensory deficits  LABORATORY DATA:  I have reviewed the data as listed    Latest Ref Rng & Units 04/25/2023    9:44 AM 10/25/2022    8:11 AM 07/30/2022   10:05 AM  CBC  WBC 4.0 - 10.5 K/uL 7.7  7.4  7.8   Hemoglobin 12.0 - 15.0 g/dL 16.1  09.6  04.5   Hematocrit 36.0 - 46.0 % 43.9  40.8  43.3   Platelets 150 - 400 K/uL 246  236  249        Latest Ref Rng & Units 04/25/2023    9:44 AM 10/25/2022    8:11 AM 07/30/2022   10:05 AM  CMP  Glucose 70 - 99 mg/dL 75  88  95   BUN 8 - 23 mg/dL 14  15  15    Creatinine 0.44 - 1.00 mg/dL 4.09  8.11  9.14   Sodium 135 -  145 mmol/L 141  141  140   Potassium 3.5 - 5.1 mmol/L 3.9  3.6  4.5   Chloride 98 - 111 mmol/L 106  105  107   CO2 22 - 32 mmol/L 29  29  30    Calcium 8.9 - 10.3 mg/dL 9.5  9.3  9.7   Total Protein 6.5 - 8.1 g/dL 6.8  6.7  7.3   Total Bilirubin 0.3 - 1.2 mg/dL 0.3  0.3  0.3   Alkaline Phos 38 - 126 U/L 75  66  78   AST 15 - 41 U/L 16  15  16    ALT 0 - 44 U/L 15  15  15      RADIOGRAPHIC STUDIES: CT CHEST ABDOMEN PELVIS WO CONTRAST  Result Date: 04/20/2023 CLINICAL DATA:  History of follicular lymphoma, follow-up. * Tracking Code: BO * EXAM: CT CHEST, ABDOMEN AND PELVIS WITHOUT CONTRAST TECHNIQUE: Multidetector CT imaging of the chest, abdomen and pelvis was performed following the standard protocol without IV contrast. RADIATION DOSE REDUCTION: This exam was performed according to the departmental dose-optimization program which includes automated exposure control, adjustment of the mA and/or kV according to patient size and/or use of iterative reconstruction technique. COMPARISON:  Multiple priors including most recent CT November 02, 2022 FINDINGS: CT CHEST FINDINGS Cardiovascular: Aortic and branch vessel atherosclerosis. Lipomatous hypertrophy of the intra-atrial septum. Calcifications of the mitral annulus and aortic valve. Coronary artery calcifications. Normal size heart. No significant pericardial effusion/thickening. Mediastinum/Nodes: No suspicious thyroid nodule. No pathologically enlarged mediastinal, hilar or axillary lymph nodes noting limited evaluation of the hilar structures on noncontrast enhanced examination. The esophagus is grossly unremarkable. Lungs/Pleura: No suspicious pulmonary nodules or masses. Mild paraseptal emphysema. Scarring/atelectasis in the lingula and left lower lobe. No pleural effusion. No pneumothorax. Musculoskeletal: No aggressive lytic or blastic lesion of bone. Multilevel degenerative changes  spine. Subcutaneous right lesion in the paramedian right anterior  chest wall now measures 10 x 5 mm on image 44/3 previously 12 x 7 mm. CT ABDOMEN PELVIS FINDINGS Hepatobiliary: Unremarkable noncontrast enhanced appearance of the hepatic parenchyma. Gallbladder surgically absent. No biliary ductal dilation. Pancreas: No pancreatic ductal dilation or evidence of acute inflammation. Spleen: No splenomegaly. Adrenals/Urinary Tract: Bilateral adrenal glands appear normal. No hydronephrosis. No renal, ureteral or bladder calculi. Urinary bladder is unremarkable for degree of distension. Stomach/Bowel: Radiopaque enteric contrast material traverses the splenic flexure. Stomach is unremarkable for degree of distension. No pathologic dilation of small or large bowel. Normal appendix. Colonic diverticulosis without findings of acute diverticulitis. Vascular/Lymphatic: Aortic atherosclerosis. Similar focal ectasia of the infrarenal abdominal aorta measuring 2.5 cm common not aneurysmally dilated by size criteria and requiring no independent imaging follow-up. No pathologically enlarged abdominal or pelvic lymph nodes. Reproductive: Status post hysterectomy. No adnexal masses. Other: No significant abdominopelvic free fluid. Musculoskeletal: No aggressive lytic or blastic lesion of bone. Multilevel degenerative change of the spine. IMPRESSION: 1. Continued decrease in size of the right chest wall lesion. 2. No adenopathy in the chest, abdomen or pelvis and no splenomegaly. 3. Aortic Atherosclerosis (ICD10-I70.0) and Emphysema (ICD10-J43.9). Electronically Signed   By: Maudry Mayhew M.D.   On: 04/20/2023 16:15    ASSESSMENT & PLAN Tina [name] 72 y.o. 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 -- Patient has completed definitive radiation to the lymphomatous mass of the abdomen.  This should be  curative therapy --labs today show Raimer blood cell count 7.7, Hgb 14.9, MCV 93.2, Plt 246 --post treatment PET CT scan on 10/01/2021 showed completed response to treatment with residual lesion showing Deauville 2  --No clinical signs of recurrence. Continue with surveillance.  --continue CT scan annually x 3 years, due in July 2025.   --Plan have the patient return to clinic in 6 months.  No orders of the defined types were placed in this encounter.   All questions were answered. The patient knows to call the clinic with any problems, questions or concerns.  I have spent a total of 30 minutes minutes of face-to-face and non-face-to-face time, preparing to see the patient,  performing a medically appropriate examination, counseling and educating the patient, ordering tests/procedures, documenting clinical information in the electronic health record, independently interpreting results and communicating results to the patient, and care coordination.   Ulysees Barns, MD Department of Hematology/Oncology [facility] Phone: [phone] Pager: [phone] Email: [email or web address]  04/25/2023 1:18 PM

## 2023-04-25 ENCOUNTER — Other Ambulatory Visit: Payer: Self-pay

## 2023-04-25 ENCOUNTER — Inpatient Hospital Stay: Payer: Medicare HMO | Admitting: Hematology and Oncology

## 2023-04-25 ENCOUNTER — Inpatient Hospital Stay: Payer: Medicare HMO | Attending: Hematology and Oncology

## 2023-04-25 VITALS — BP 134/68 | HR 106 | Temp 97.4°F | Resp 17 | Wt 253.3 lb

## 2023-04-25 DIAGNOSIS — C8203 Follicular lymphoma grade I, intra-abdominal lymph nodes: Secondary | ICD-10-CM | POA: Insufficient documentation

## 2023-04-25 DIAGNOSIS — Z79899 Other long term (current) drug therapy: Secondary | ICD-10-CM | POA: Insufficient documentation

## 2023-04-25 DIAGNOSIS — Z9049 Acquired absence of other specified parts of digestive tract: Secondary | ICD-10-CM | POA: Insufficient documentation

## 2023-04-25 DIAGNOSIS — Z885 Allergy status to narcotic agent status: Secondary | ICD-10-CM | POA: Diagnosis not present

## 2023-04-25 DIAGNOSIS — Z923 Personal history of irradiation: Secondary | ICD-10-CM | POA: Diagnosis not present

## 2023-04-25 DIAGNOSIS — Z888 Allergy status to other drugs, medicaments and biological substances status: Secondary | ICD-10-CM | POA: Insufficient documentation

## 2023-04-25 DIAGNOSIS — Z90710 Acquired absence of both cervix and uterus: Secondary | ICD-10-CM | POA: Diagnosis not present

## 2023-04-25 DIAGNOSIS — I700 Atherosclerosis of aorta: Secondary | ICD-10-CM | POA: Diagnosis not present

## 2023-04-25 DIAGNOSIS — F17210 Nicotine dependence, cigarettes, uncomplicated: Secondary | ICD-10-CM | POA: Insufficient documentation

## 2023-04-25 DIAGNOSIS — C8290 Follicular lymphoma, unspecified, unspecified site: Secondary | ICD-10-CM

## 2023-04-25 LAB — LACTATE DEHYDROGENASE: LDH: 116 U/L (ref 98–192)

## 2023-04-25 LAB — CBC WITH DIFFERENTIAL (CANCER CENTER ONLY)
Abs Immature Granulocytes: 0.01 10*3/uL (ref 0.00–0.07)
Basophils Absolute: 0 10*3/uL (ref 0.0–0.1)
Basophils Relative: 1 %
Eosinophils Absolute: 0.3 10*3/uL (ref 0.0–0.5)
Eosinophils Relative: 3 %
HCT: 43.9 % (ref 36.0–46.0)
Hemoglobin: 14.9 g/dL (ref 12.0–15.0)
Immature Granulocytes: 0 %
Lymphocytes Relative: 31 %
Lymphs Abs: 2.4 10*3/uL (ref 0.7–4.0)
MCH: 31.6 pg (ref 26.0–34.0)
MCHC: 33.9 g/dL (ref 30.0–36.0)
MCV: 93.2 fL (ref 80.0–100.0)
Monocytes Absolute: 0.6 10*3/uL (ref 0.1–1.0)
Monocytes Relative: 8 %
Neutro Abs: 4.4 10*3/uL (ref 1.7–7.7)
Neutrophils Relative %: 57 %
Platelet Count: 246 10*3/uL (ref 150–400)
RBC: 4.71 MIL/uL (ref 3.87–5.11)
RDW: 13.2 % (ref 11.5–15.5)
WBC Count: 7.7 10*3/uL (ref 4.0–10.5)
nRBC: 0 % (ref 0.0–0.2)

## 2023-04-25 LAB — CMP (CANCER CENTER ONLY)
ALT: 15 U/L (ref 0–44)
AST: 16 U/L (ref 15–41)
Albumin: 3.9 g/dL (ref 3.5–5.0)
Alkaline Phosphatase: 75 U/L (ref 38–126)
Anion gap: 6 (ref 5–15)
BUN: 14 mg/dL (ref 8–23)
CO2: 29 mmol/L (ref 22–32)
Calcium: 9.5 mg/dL (ref 8.9–10.3)
Chloride: 106 mmol/L (ref 98–111)
Creatinine: 1 mg/dL (ref 0.44–1.00)
GFR, Estimated: >60 mL/min (ref >60)
Glucose, Bld: 75 mg/dL (ref 70–99)
Potassium: 3.9 mmol/L (ref 3.5–5.1)
Sodium: 141 mmol/L (ref 135–145)
Total Bilirubin: 0.3 mg/dL (ref 0.3–1.2)
Total Protein: 6.8 g/dL (ref 6.5–8.1)

## 2023-07-04 DIAGNOSIS — M8589 Other specified disorders of bone density and structure, multiple sites: Secondary | ICD-10-CM | POA: Diagnosis not present

## 2023-07-04 DIAGNOSIS — R7303 Prediabetes: Secondary | ICD-10-CM | POA: Diagnosis not present

## 2023-07-04 DIAGNOSIS — I700 Atherosclerosis of aorta: Secondary | ICD-10-CM | POA: Diagnosis not present

## 2023-07-04 DIAGNOSIS — F3342 Major depressive disorder, recurrent, in full remission: Secondary | ICD-10-CM | POA: Diagnosis not present

## 2023-07-04 DIAGNOSIS — F17210 Nicotine dependence, cigarettes, uncomplicated: Secondary | ICD-10-CM | POA: Diagnosis not present

## 2023-07-04 DIAGNOSIS — E785 Hyperlipidemia, unspecified: Secondary | ICD-10-CM | POA: Diagnosis not present

## 2023-07-04 DIAGNOSIS — Z6841 Body Mass Index (BMI) 40.0 and over, adult: Secondary | ICD-10-CM | POA: Diagnosis not present

## 2023-07-04 DIAGNOSIS — L989 Disorder of the skin and subcutaneous tissue, unspecified: Secondary | ICD-10-CM | POA: Diagnosis not present

## 2023-08-04 DIAGNOSIS — L820 Inflamed seborrheic keratosis: Secondary | ICD-10-CM | POA: Diagnosis not present

## 2023-08-04 DIAGNOSIS — Z789 Other specified health status: Secondary | ICD-10-CM | POA: Diagnosis not present

## 2023-08-04 DIAGNOSIS — L538 Other specified erythematous conditions: Secondary | ICD-10-CM | POA: Diagnosis not present

## 2023-10-18 DIAGNOSIS — L570 Actinic keratosis: Secondary | ICD-10-CM | POA: Diagnosis not present

## 2023-10-18 DIAGNOSIS — L538 Other specified erythematous conditions: Secondary | ICD-10-CM | POA: Diagnosis not present

## 2023-10-18 DIAGNOSIS — L820 Inflamed seborrheic keratosis: Secondary | ICD-10-CM | POA: Diagnosis not present

## 2023-10-18 DIAGNOSIS — Z789 Other specified health status: Secondary | ICD-10-CM | POA: Diagnosis not present

## 2023-10-18 DIAGNOSIS — L2989 Other pruritus: Secondary | ICD-10-CM | POA: Diagnosis not present

## 2023-10-23 ENCOUNTER — Other Ambulatory Visit: Payer: Self-pay | Admitting: Hematology and Oncology

## 2023-10-23 DIAGNOSIS — C8290 Follicular lymphoma, unspecified, unspecified site: Secondary | ICD-10-CM

## 2023-10-24 ENCOUNTER — Inpatient Hospital Stay: Payer: Medicare HMO | Attending: Hematology and Oncology

## 2023-10-24 ENCOUNTER — Inpatient Hospital Stay: Payer: Medicare HMO | Admitting: Hematology and Oncology

## 2023-10-24 VITALS — BP 154/75 | HR 100 | Temp 97.5°F | Resp 16 | Wt 254.5 lb

## 2023-10-24 DIAGNOSIS — Z9049 Acquired absence of other specified parts of digestive tract: Secondary | ICD-10-CM | POA: Diagnosis not present

## 2023-10-24 DIAGNOSIS — Z90710 Acquired absence of both cervix and uterus: Secondary | ICD-10-CM | POA: Diagnosis not present

## 2023-10-24 DIAGNOSIS — C8203 Follicular lymphoma grade I, intra-abdominal lymph nodes: Secondary | ICD-10-CM | POA: Diagnosis not present

## 2023-10-24 DIAGNOSIS — Z885 Allergy status to narcotic agent status: Secondary | ICD-10-CM | POA: Diagnosis not present

## 2023-10-24 DIAGNOSIS — Z923 Personal history of irradiation: Secondary | ICD-10-CM | POA: Diagnosis not present

## 2023-10-24 DIAGNOSIS — C8290 Follicular lymphoma, unspecified, unspecified site: Secondary | ICD-10-CM | POA: Diagnosis not present

## 2023-10-24 DIAGNOSIS — Z91041 Radiographic dye allergy status: Secondary | ICD-10-CM | POA: Diagnosis not present

## 2023-10-24 DIAGNOSIS — Z888 Allergy status to other drugs, medicaments and biological substances status: Secondary | ICD-10-CM | POA: Insufficient documentation

## 2023-10-24 DIAGNOSIS — F17210 Nicotine dependence, cigarettes, uncomplicated: Secondary | ICD-10-CM | POA: Insufficient documentation

## 2023-10-24 DIAGNOSIS — Z79899 Other long term (current) drug therapy: Secondary | ICD-10-CM | POA: Insufficient documentation

## 2023-10-24 LAB — CBC WITH DIFFERENTIAL (CANCER CENTER ONLY)
Abs Immature Granulocytes: 0.01 10*3/uL (ref 0.00–0.07)
Basophils Absolute: 0 10*3/uL (ref 0.0–0.1)
Basophils Relative: 1 %
Eosinophils Absolute: 0.2 10*3/uL (ref 0.0–0.5)
Eosinophils Relative: 3 %
HCT: 43.1 % (ref 36.0–46.0)
Hemoglobin: 14.9 g/dL (ref 12.0–15.0)
Immature Granulocytes: 0 %
Lymphocytes Relative: 31 %
Lymphs Abs: 2.3 10*3/uL (ref 0.7–4.0)
MCH: 32.1 pg (ref 26.0–34.0)
MCHC: 34.6 g/dL (ref 30.0–36.0)
MCV: 92.9 fL (ref 80.0–100.0)
Monocytes Absolute: 0.7 10*3/uL (ref 0.1–1.0)
Monocytes Relative: 9 %
Neutro Abs: 4.2 10*3/uL (ref 1.7–7.7)
Neutrophils Relative %: 56 %
Platelet Count: 226 10*3/uL (ref 150–400)
RBC: 4.64 MIL/uL (ref 3.87–5.11)
RDW: 13.2 % (ref 11.5–15.5)
WBC Count: 7.5 10*3/uL (ref 4.0–10.5)
nRBC: 0 % (ref 0.0–0.2)

## 2023-10-24 LAB — CMP (CANCER CENTER ONLY)
ALT: 15 U/L (ref 0–44)
AST: 17 U/L (ref 15–41)
Albumin: 4 g/dL (ref 3.5–5.0)
Alkaline Phosphatase: 76 U/L (ref 38–126)
Anion gap: 6 (ref 5–15)
BUN: 14 mg/dL (ref 8–23)
CO2: 27 mmol/L (ref 22–32)
Calcium: 9.5 mg/dL (ref 8.9–10.3)
Chloride: 106 mmol/L (ref 98–111)
Creatinine: 0.94 mg/dL (ref 0.44–1.00)
GFR, Estimated: >60 mL/min (ref >60)
Glucose, Bld: 104 mg/dL — ABNORMAL HIGH (ref 70–99)
Potassium: 3.9 mmol/L (ref 3.5–5.1)
Sodium: 139 mmol/L (ref 135–145)
Total Bilirubin: 0.4 mg/dL (ref 0.0–1.2)
Total Protein: 7 g/dL (ref 6.5–8.1)

## 2023-10-24 LAB — LACTATE DEHYDROGENASE: LDH: 118 U/L (ref 98–192)

## 2023-10-24 NOTE — Progress Notes (Unsigned)
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 04/03/2021: establish care with Dr. Leonides Schanz 06/10/21-07/03/2021: definitive radiation therapy 10/01/2021: PET CT scan showed hypermetabolic soft tissue lesion of the right chest wall is decreased in size and demonstrates decreased hypermetabolic activity. Deauville 2. Consistent with response to therapy.   Interval History:  Nanie Humfleet 73 y.o. 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 The patient's last visit was on 04/25/2023. In the interim since the last visit she has had no major changes in her health.   On exam today Ms. Beichner reports she has been well overall with interim since her last visit.  She has had no major changes in her health.  She reports she has had no hospitalizations or ER visits.  She reports her energy today is about a 10 out of 10.  Her appetite however has been "on and off".  Her weight is stable at 254 pounds, though she would like to lose some.  She reports that she had some dermatology work done recently with some lesions frozen off her face.  She reports that she recently began taking a nutritional supplement called Weem to help with her hair loss/thinning.  She reports that she has no bumps or lumps or other findings concerning for lymphadenopathy.  Overall she feels quite well with no questions  concerns or complaints today.  She denies any fevers, chills, sweats.  A full 10 point ROS is otherwise negative.  A full 10 point ROS is listed below.  MEDICAL HISTORY:  Past Medical History:  Diagnosis Date   Anxiety    Bronchitis    hx   COPD (chronic obstructive pulmonary disease) (HCC)    smoker 1ppp   Depression    Hyperlipidemia    Smoker    1ppd    SURGICAL HISTORY: Past Surgical History:  Procedure Laterality Date   ABDOMINAL HYSTERECTOMY     BACK SURGERY     BIOPSY OF SKIN SUBCUTANEOUS TISSUE AND/OR MUCOUS MEMBRANE N/A 05/07/2021   Procedure: EXCISIONAL BIOPSY ABDOMINAL WALL MASS;  Surgeon: Fritzi Mandes, MD;  Location: [facility] OR;  Service: General;  Laterality: N/A;   CARPAL TUNNEL RELEASE     rt   CERVICAL DISC SURGERY  10/04/2010   CHOLECYSTECTOMY     KNEE ARTHROSCOPY     lft   KNEE ARTHROSCOPY Right 06/10/2020   Procedure: RIGHT KNEE ARTHROSCOPY AND DEBRIDEMENT;  Surgeon: Nadara Mustard, MD;  Location: [facility];  Service: Orthopedics;  Laterality: Right;   LUMBAR LAMINECTOMY  09/30/2011   Procedure: MICRODISCECTOMY LUMBAR LAMINECTOMY;  Surgeon: Kerrin Champagne, MD;  Location: [facility] OR;  Service: Orthopedics;  Laterality: Right;  Right L4-5 Microdiscectomy using MIS approach    SOCIAL HISTORY: Social History   Socioeconomic History   Marital status: Married    Spouse name: Not on file   Number of children: Not on file   Years of education: Not on file   Highest education level: Not on file  Occupational  History   Not on file  Tobacco Use   Smoking status: Every Day    Current packs/day: 1.00    Types: Cigarettes   Smokeless tobacco: Never  Substance and Sexual Activity   Alcohol use: No   Drug use: No   Sexual activity: Not on file    Comment: hysterectomy  Other Topics Concern   Not on file  Social History Narrative   Not on file   Social Drivers of Health   Financial Resource Strain: Low Risk  (04/30/2021)   Overall Financial  Resource Strain (CARDIA)    Difficulty of Paying Living Expenses: Not very hard  Food Insecurity: No Food Insecurity (04/30/2021)   Hunger Vital Sign    Worried About Running Out of Food in the Last Year: Never true    Ran Out of Food in the Last Year: Never true  Transportation Needs: No Transportation Needs (04/30/2021)   PRAPARE - Administrator, Civil Service (Medical): No    Lack of Transportation (Non-Medical): No  Physical Activity: Not on file  Stress: Stress Concern Present (04/30/2021)   [employer] of Occupational Health - Occupational Stress Questionnaire    Feeling of Stress : To some extent  Social Connections: Unknown (04/01/2022)   Received from [facility], [facility]   Social Network    Social Network: Not on file  Intimate Partner Violence: Unknown (04/01/2022)   Received from [facility], [facility]   HITS    Physically Hurt: Not on file    Insult or Talk Down To: Not on file    Threaten Physical Harm: Not on file    Scream or Curse: Not on file    FAMILY HISTORY: No family history on file.  ALLERGIES:  is allergic to prednisone, dilaudid [hydromorphone hcl], percocet [oxycodone-acetaminophen], contrast media [iodinated contrast media], and tape.  MEDICATIONS:  Current Outpatient Medications  Medication Sig Dispense Refill   ALPRAZolam (XANAX) 0.5 MG tablet Take 0.5 mg by mouth 2 (two) times daily.     atorvastatin (LIPITOR) 20 MG tablet Take 20 mg by mouth daily.     CALCIUM PO Take 600 mg by mouth daily.     desipramine (NORPRAMIN) 50 MG tablet Take 50 mg by mouth daily.     ibuprofen (ADVIL) 200 MG tablet Take 200 mg by mouth every 6 (six) hours as needed for headache or moderate pain.     Multiple Vitamins-Minerals (MULTIVITAMINS THER. W/MINERALS) TABS Take 1 tablet by mouth daily.     sertraline (ZOLOFT) 100 MG tablet Take 200 mg by mouth daily.     vitamin C (ASCORBIC ACID) 500 MG tablet Take 1,000 mg by mouth daily.     No  current facility-administered medications for this visit.    REVIEW OF SYSTEMS:   Constitutional: ( - ) fevers, ( - )  chills , ( - ) night sweats Eyes: ( - ) blurriness of vision, ( - ) double vision, ( - ) watery eyes Ears, nose, mouth, throat, and face: ( - ) mucositis, ( - ) sore throat Respiratory: ( - ) cough, ( - ) dyspnea, ( - ) wheezes Cardiovascular: ( - ) palpitation, ( - ) chest discomfort, ( - ) lower extremity swelling Gastrointestinal:  ( - ) nausea, ( - ) heartburn, ( - ) change in bowel habits Skin: ( - ) abnormal skin rashes Lymphatics: ( - ) new lymphadenopathy, ( - ) easy bruising Neurological: ( - ) numbness, ( - )  tingling, ( - ) new weaknesses Behavioral/Psych: ( - ) mood change, ( - ) new changes  All other systems were reviewed with the patient and are negative.  PHYSICAL EXAMINATION: ECOG PERFORMANCE STATUS: 0 - Asymptomatic  Vitals:   10/24/23 1027  BP: (!) 154/75  Pulse: 100  Resp: 16  Temp: (!) 97.5 F (36.4 C)  SpO2: 98%     Filed Weights   10/24/23 1027  Weight: 254 lb 8 oz (115.4 kg)      GENERAL: Well-appearing elderly Caucasian female, alert, no distress and comfortable SKIN: skin color, texture, turgor are normal, no rashes EYES: conjunctiva are pink and non-injected, sclera clear LUNGS: clear to auscultation and percussion with normal breathing effort HEART: regular rate & rhythm and no murmurs and no lower extremity edema Musculoskeletal: no cyanosis of digits and no clubbing  PSYCH: alert & oriented x 3, fluent speech NEURO: no focal motor/sensory deficits  LABORATORY DATA:  I have reviewed the data as listed    Latest Ref Rng & Units 10/24/2023    9:47 AM 04/25/2023    9:44 AM 10/25/2022    8:11 AM  CBC  WBC 4.0 - 10.5 K/uL 7.5  7.7  7.4   Hemoglobin 12.0 - 15.0 g/dL 16.1  09.6  04.5   Hematocrit 36.0 - 46.0 % 43.1  43.9  40.8   Platelets 150 - 400 K/uL 226  246  236        Latest Ref Rng & Units 10/24/2023    9:47 AM  04/25/2023    9:44 AM 10/25/2022    8:11 AM  CMP  Glucose 70 - 99 mg/dL 409  75  88   BUN 8 - 23 mg/dL 14  14  15    Creatinine 0.44 - 1.00 mg/dL 8.11  9.14  7.82   Sodium 135 - 145 mmol/L 139  141  141   Potassium 3.5 - 5.1 mmol/L 3.9  3.9  3.6   Chloride 98 - 111 mmol/L 106  106  105   CO2 22 - 32 mmol/L 27  29  29    Calcium 8.9 - 10.3 mg/dL 9.5  9.5  9.3   Total Protein 6.5 - 8.1 g/dL 7.0  6.8  6.7   Total Bilirubin 0.0 - 1.2 mg/dL 0.4  0.3  0.3   Alkaline Phos 38 - 126 U/L 76  75  66   AST 15 - 41 U/L 17  16  15    ALT 0 - 44 U/L 15  15  15      RADIOGRAPHIC STUDIES: No results found.  ASSESSMENT & PLAN Aury Toia Snelling 73 y.o. 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 -- Patient has completed definitive radiation to the lymphomatous mass of the abdomen.  This should be curative therapy --labs today show Grams blood cell count 7.5, Hgb 14.9, MCV 92.9, Plt 226. Cr 0.94 --post treatment PET CT scan on 10/01/2021 showed completed response to treatment with residual lesion showing Deauville 2  --No clinical signs of recurrence. Continue with surveillance.  --continue CT scan annually x 3 years, due in July 2025.   --Plan have the patient return to clinic in 6 months with CT scan .  No orders of the defined types were placed in this encounter.   All questions were answered. The patient knows to  call the clinic with any problems, questions or concerns.  I have spent a total of 25 minutes minutes of face-to-face and non-face-to-face time, preparing to see the patient,  performing a medically appropriate examination, counseling and educating the patient, ordering tests/procedures, documenting clinical information in the electronic health record, independently interpreting results and communicating  results to the patient, and care coordination.   Ulysees Barns, MD Department of Hematology/Oncology [facility] Phone: [phone] Pager: [phone] Email: [email or web address]  10/25/2023 2:11 PM

## 2023-12-08 ENCOUNTER — Encounter: Payer: Self-pay | Admitting: Orthopedic Surgery

## 2023-12-08 ENCOUNTER — Ambulatory Visit: Payer: Medicare HMO | Admitting: Orthopedic Surgery

## 2023-12-08 DIAGNOSIS — M25561 Pain in right knee: Secondary | ICD-10-CM

## 2023-12-08 DIAGNOSIS — G8929 Other chronic pain: Secondary | ICD-10-CM | POA: Diagnosis not present

## 2023-12-08 MED ORDER — METHYLPREDNISOLONE ACETATE 40 MG/ML IJ SUSP
40.0000 mg | INTRAMUSCULAR | Status: AC | PRN
Start: 2023-12-08 — End: 2023-12-08
  Administered 2023-12-08: 40 mg via INTRA_ARTICULAR

## 2023-12-08 MED ORDER — LIDOCAINE HCL (PF) 1 % IJ SOLN
5.0000 mL | INTRAMUSCULAR | Status: AC | PRN
Start: 2023-12-08 — End: 2023-12-08
  Administered 2023-12-08: 5 mL

## 2023-12-08 NOTE — Progress Notes (Signed)
 Office Visit Note   Patient: Tina [name]           Date of Birth: [birth year]           MRN: [id number] Visit Date: 12/08/2023              Requested by: Merri Brunette, MD [phone] [address] [place],  [place] [address] PCP: Merri Brunette, MD  Chief Complaint  Patient presents with   Right Knee - Pain    Requesting injection today      HPI: Patient is a 73 year old woman who presents in follow-up for osteoarthritis right knee.  Patient has had good relief with her previous injection.  She is status post arthroscopic debridement.  Assessment & Plan: Visit Diagnoses:  1. Chronic pain of right knee     Plan: Right knee was injected she tolerated this well.  Follow-Up Instructions: Return if symptoms worsen or fail to improve.   Ortho Exam  Patient is alert, oriented, no adenopathy, well-dressed, normal affect, normal respiratory effort. Examination patient has no effusion of the right knee.  She does have large varicose veins around the knee.  Collaterals and cruciates are stable.  Imaging: No results found. No images are attached to the encounter.  Labs: Lab Results  Component Value Date   LABURIC 5.8 04/03/2021   LABURIC 5.7 07/30/2020     Lab Results  Component Value Date   ALBUMIN 4.0 10/24/2023   ALBUMIN 3.9 04/25/2023   ALBUMIN 3.8 10/25/2022    No results found for: "MG" No results found for: "VD25OH"  No results found for: "PREALBUMIN"    Latest Ref Rng & Units 10/24/2023    9:47 AM 04/25/2023    9:44 AM 10/25/2022    8:11 AM  CBC EXTENDED  WBC 4.0 - 10.5 K/uL 7.5  7.7  7.4   RBC 3.87 - 5.11 MIL/uL 4.64  4.71  4.37   Hemoglobin 12.0 - 15.0 g/dL 08.6  57.8  46.9   HCT 36.0 - 46.0 % 43.1  43.9  40.8   Platelets 150 - 400 K/uL 226  246  236   NEUT# 1.7 - 7.7 K/uL 4.2  4.4  4.1   Lymph# 0.7 - 4.0 K/uL 2.3  2.4  2.5      There is no height or weight on file to calculate BMI.  Orders:  No orders of the defined types were placed in  this encounter.  No orders of the defined types were placed in this encounter.    Procedures: Large Joint Inj: R knee on 12/08/2023 10:03 AM Indications: pain and diagnostic evaluation Details: 22 G 1.5 in needle, anteromedial approach  Arthrogram: No  Medications: 5 mL lidocaine (PF) 1 %; 40 mg methylPREDNISolone acetate 40 MG/ML Outcome: tolerated well, no immediate complications Procedure, treatment alternatives, risks and benefits explained, specific risks discussed. Consent was given by the patient. Immediately prior to procedure a time out was called to verify the correct patient, procedure, equipment, support staff and site/side marked as required. Patient was prepped and draped in the usual sterile fashion.      Clinical Data: No additional findings.  ROS:  All other systems negative, except as noted in the HPI. Review of Systems  Objective: Vital Signs: There were no vitals taken for this visit.  Specialty Comments:  No specialty comments available.  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 No pertinent family history.  Past Surgical History:  Procedure Laterality Date   ABDOMINAL HYSTERECTOMY     BACK SURGERY     BIOPSY OF SKIN SUBCUTANEOUS TISSUE AND/OR MUCOUS MEMBRANE N/A 05/07/2021   Procedure: EXCISIONAL BIOPSY ABDOMINAL WALL MASS;  Surgeon: Fritzi Mandes, MD;  Location: [facility] OR;  Service: General;  Laterality: N/A;   CARPAL TUNNEL RELEASE     rt   CERVICAL DISC SURGERY  10/04/2010   CHOLECYSTECTOMY     KNEE ARTHROSCOPY     lft   KNEE ARTHROSCOPY  Right 06/10/2020   Procedure: RIGHT KNEE ARTHROSCOPY AND DEBRIDEMENT;  Surgeon: Nadara Mustard, MD;  Location: [facility];  Service: Orthopedics;  Laterality: Right;   LUMBAR LAMINECTOMY  09/30/2011   Procedure: MICRODISCECTOMY LUMBAR LAMINECTOMY;  Surgeon: Kerrin Champagne, MD;  Location: [facility] OR;  Service: Orthopedics;  Laterality: Right;  Right L4-5 Microdiscectomy using MIS approach   Social History   Occupational History   Not on file  Tobacco Use   Smoking status: Every Day    Current packs/day: 1.00    Types: Cigarettes   Smokeless tobacco: Never  Substance and Sexual Activity   Alcohol use: No   Drug use: No   Sexual activity: Not on file    Comment: hysterectomy

## 2024-01-05 ENCOUNTER — Encounter: Payer: Self-pay | Admitting: Hematology and Oncology

## 2024-01-05 DIAGNOSIS — Z23 Encounter for immunization: Secondary | ICD-10-CM | POA: Diagnosis not present

## 2024-01-05 DIAGNOSIS — Z1331 Encounter for screening for depression: Secondary | ICD-10-CM | POA: Diagnosis not present

## 2024-01-05 DIAGNOSIS — R7303 Prediabetes: Secondary | ICD-10-CM | POA: Diagnosis not present

## 2024-01-05 DIAGNOSIS — I700 Atherosclerosis of aorta: Secondary | ICD-10-CM | POA: Diagnosis not present

## 2024-01-05 DIAGNOSIS — F3342 Major depressive disorder, recurrent, in full remission: Secondary | ICD-10-CM | POA: Diagnosis not present

## 2024-01-05 DIAGNOSIS — Z0000 Encounter for general adult medical examination without abnormal findings: Secondary | ICD-10-CM | POA: Diagnosis not present

## 2024-01-05 DIAGNOSIS — E785 Hyperlipidemia, unspecified: Secondary | ICD-10-CM | POA: Diagnosis not present

## 2024-01-05 DIAGNOSIS — C8590 Non-Hodgkin lymphoma, unspecified, unspecified site: Secondary | ICD-10-CM | POA: Diagnosis not present

## 2024-01-05 DIAGNOSIS — Z1211 Encounter for screening for malignant neoplasm of colon: Secondary | ICD-10-CM | POA: Diagnosis not present

## 2024-01-15 DIAGNOSIS — Z1211 Encounter for screening for malignant neoplasm of colon: Secondary | ICD-10-CM | POA: Diagnosis not present

## 2024-01-15 DIAGNOSIS — Z1212 Encounter for screening for malignant neoplasm of rectum: Secondary | ICD-10-CM | POA: Diagnosis not present

## 2024-01-21 LAB — COLOGUARD: COLOGUARD: POSITIVE — AB

## 2024-02-23 DIAGNOSIS — M779 Enthesopathy, unspecified: Secondary | ICD-10-CM | POA: Diagnosis not present

## 2024-02-23 DIAGNOSIS — M25511 Pain in right shoulder: Secondary | ICD-10-CM | POA: Diagnosis not present

## 2024-03-06 ENCOUNTER — Other Ambulatory Visit (INDEPENDENT_AMBULATORY_CARE_PROVIDER_SITE_OTHER): Payer: Self-pay

## 2024-03-06 ENCOUNTER — Encounter: Payer: Self-pay | Admitting: Family

## 2024-03-06 ENCOUNTER — Ambulatory Visit (INDEPENDENT_AMBULATORY_CARE_PROVIDER_SITE_OTHER): Admitting: Family

## 2024-03-06 DIAGNOSIS — M67911 Unspecified disorder of synovium and tendon, right shoulder: Secondary | ICD-10-CM | POA: Diagnosis not present

## 2024-03-06 DIAGNOSIS — M25511 Pain in right shoulder: Secondary | ICD-10-CM

## 2024-03-06 MED ORDER — HYDROCODONE-ACETAMINOPHEN 5-325 MG PO TABS: 1.0000 | ORAL_TABLET | Freq: Four times a day (QID) | ORAL | 0 refills | Status: AC | PRN

## 2024-03-06 NOTE — Progress Notes (Signed)
 Office Visit Note   Patient: Tina [name]           Date of Birth: [birth year]           MRN: [id number] Visit Date: 03/06/2024              Requested by: Faustina Hood, MD [phone] [address] [place],  [place] [address] PCP: Faustina Hood, MD  Chief Complaint  Patient presents with   Right Shoulder - Pain      HPI: The patient is a 73 year old woman complaining of 2-week history of right shoulder pain after pulling a lounge chair from behind her garage.  She had immediate onset of pain this is in the right shoulder blade and radiates down her right arm she has some numbness and tingling in all 5 fingers that she has pain deep aching difficulty sleeping pain with above head and behind back reaching denies neck pain she has tried using Tylenol  Advil IcyHot Salonpas heat and ice all without relief  She is right-hand dominant  Assessment & Plan: Visit Diagnoses:  1. Acute pain of right shoulder     Plan: Depo-Medrol  injection right shoulder.  Patient tolerated well.  She will proceed with home exercise program given a short course of pain medication she may continue using heat she will call or return in 4 weeks if she fails to improve  Follow-Up Instructions: No follow-ups on file.   Right Shoulder Exam   Tenderness  The patient is experiencing tenderness in the biceps tendon.  Range of Motion  The patient has normal right shoulder ROM.  Muscle Strength  The patient has normal right shoulder strength.  Tests  Impingement: negative Drop arm: negative  Other  Pulse: present  Comments:  Pain reaching above head and behind back      Patient is alert, oriented, no adenopathy, well-dressed, normal affect, normal respiratory effort.  Narcotics: @CHLMME @  Imaging: No results found. No images are attached to the encounter.  Labs: Lab Results  Component Value Date   LABURIC 5.8 04/03/2021   LABURIC 5.7 07/30/2020     Lab Results  Component Value  Date   ALBUMIN 4.0 10/24/2023   ALBUMIN 3.9 04/25/2023   ALBUMIN 3.8 10/25/2022    No results found for: "MG" No results found for: "VD25OH"  No results found for: "PREALBUMIN"    Latest Ref Rng & Units 10/24/2023    9:47 AM 04/25/2023    9:44 AM 10/25/2022    8:11 AM  CBC EXTENDED  WBC 4.0 - 10.5 K/uL 7.5  7.7  7.4   RBC 3.87 - 5.11 MIL/uL 4.64  4.71  4.37   Hemoglobin 12.0 - 15.0 g/dL 21.3  08.6  57.8   HCT 36.0 - 46.0 % 43.1  43.9  40.8   Platelets 150 - 400 K/uL 226  246  236   NEUT# 1.7 - 7.7 K/uL 4.2  4.4  4.1   Lymph# 0.7 - 4.0 K/uL 2.3  2.4  2.5      There is no height or weight on file to calculate BMI.  Orders:  Orders Placed This Encounter  Procedures   XR Shoulder Right   No orders of the defined types were placed in this encounter.    Procedures: No procedures performed  Clinical Data: No additional findings.  ROS:  All other systems negative, except as noted in the HPI. Review of Systems  Objective: Vital Signs: There were no vitals taken for  this visit.  Specialty Comments:  No specialty comments available.  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 No pertinent family history.  Past Surgical History:  Procedure Laterality Date   ABDOMINAL HYSTERECTOMY     BACK SURGERY     BIOPSY OF SKIN SUBCUTANEOUS TISSUE AND/OR MUCOUS MEMBRANE N/A 05/07/2021   Procedure: EXCISIONAL BIOPSY ABDOMINAL WALL MASS;  Surgeon: Lujean Sake, MD;  Location: [facility] OR;  Service: General;  Laterality: N/A;   CARPAL TUNNEL RELEASE     rt   CERVICAL DISC SURGERY   10/04/2010   CHOLECYSTECTOMY     KNEE ARTHROSCOPY     lft   KNEE ARTHROSCOPY Right 06/10/2020   Procedure: RIGHT KNEE ARTHROSCOPY AND DEBRIDEMENT;  Surgeon: Timothy Ford, MD;  Location: [facility];  Service: Orthopedics;  Laterality: Right;   LUMBAR LAMINECTOMY  09/30/2011   Procedure: MICRODISCECTOMY LUMBAR LAMINECTOMY;  Surgeon: Alphonso Jean, MD;  Location: [facility] OR;  Service: Orthopedics;  Laterality: Right;  Right L4-5 Microdiscectomy using MIS approach   Social History   Occupational History   Not on file  Tobacco Use   Smoking status: Every Day    Current packs/day: 1.00    Types: Cigarettes   Smokeless tobacco: Never  Substance and Sexual Activity   Alcohol use: No   Drug use: No   Sexual activity: Not on file    Comment: hysterectomy

## 2024-03-12 DIAGNOSIS — R946 Abnormal results of thyroid function studies: Secondary | ICD-10-CM | POA: Diagnosis not present

## 2024-03-21 ENCOUNTER — Encounter: Payer: Self-pay | Admitting: Hematology and Oncology

## 2024-03-23 ENCOUNTER — Telehealth: Payer: Self-pay | Admitting: *Deleted

## 2024-03-23 NOTE — Telephone Encounter (Signed)
 Received call from pt regarding her upcoming CT scan. She wanted to clarify whether she needs oral contrast or not. Advised that she does need oral contrast but no IV contrast due to her allergies to this. Advised that  I would clarify this with radiology and appropriate schedule changes would be made to accommodate the oral contrast. Pt voiced understanding. Weldon Hales in Radiology made aware and was able to adjust her CT scan date and time

## 2024-04-16 ENCOUNTER — Ambulatory Visit (HOSPITAL_COMMUNITY)

## 2024-04-17 ENCOUNTER — Ambulatory Visit (HOSPITAL_COMMUNITY)

## 2024-04-20 ENCOUNTER — Ambulatory Visit (HOSPITAL_COMMUNITY)
Admission: RE | Admit: 2024-04-20 | Discharge: 2024-04-20 | Disposition: A | Discharge status: Home / Self Care | Source: Ambulatory Visit | Attending: Hematology and Oncology | Admitting: Hematology and Oncology

## 2024-04-20 DIAGNOSIS — J439 Emphysema, unspecified: Secondary | ICD-10-CM | POA: Diagnosis not present

## 2024-04-20 DIAGNOSIS — C8290 Follicular lymphoma, unspecified, unspecified site: Secondary | ICD-10-CM | POA: Insufficient documentation

## 2024-04-20 DIAGNOSIS — I700 Atherosclerosis of aorta: Secondary | ICD-10-CM | POA: Diagnosis not present

## 2024-04-20 DIAGNOSIS — K5730 Diverticulosis of large intestine without perforation or abscess without bleeding: Secondary | ICD-10-CM | POA: Diagnosis not present

## 2024-04-20 MED ORDER — IOHEXOL 9 MG/ML PO SOLN
ORAL | Status: AC
  Filled 2024-04-20: qty 1000

## 2024-04-23 ENCOUNTER — Inpatient Hospital Stay: Payer: Medicare HMO | Attending: Hematology and Oncology

## 2024-04-23 ENCOUNTER — Inpatient Hospital Stay (HOSPITAL_BASED_OUTPATIENT_CLINIC_OR_DEPARTMENT_OTHER): Payer: Medicare HMO | Admitting: Hematology and Oncology

## 2024-04-23 ENCOUNTER — Other Ambulatory Visit: Payer: Self-pay | Admitting: Hematology and Oncology

## 2024-04-23 VITALS — BP 123/89 | HR 95 | Temp 97.6°F | Resp 18 | Ht 63.0 in | Wt 250.1 lb

## 2024-04-23 DIAGNOSIS — J439 Emphysema, unspecified: Secondary | ICD-10-CM | POA: Insufficient documentation

## 2024-04-23 DIAGNOSIS — I700 Atherosclerosis of aorta: Secondary | ICD-10-CM | POA: Diagnosis not present

## 2024-04-23 DIAGNOSIS — Z885 Allergy status to narcotic agent status: Secondary | ICD-10-CM | POA: Diagnosis not present

## 2024-04-23 DIAGNOSIS — Z79899 Other long term (current) drug therapy: Secondary | ICD-10-CM | POA: Diagnosis not present

## 2024-04-23 DIAGNOSIS — C8290 Follicular lymphoma, unspecified, unspecified site: Secondary | ICD-10-CM

## 2024-04-23 DIAGNOSIS — Z9049 Acquired absence of other specified parts of digestive tract: Secondary | ICD-10-CM | POA: Diagnosis not present

## 2024-04-23 DIAGNOSIS — F17210 Nicotine dependence, cigarettes, uncomplicated: Secondary | ICD-10-CM | POA: Insufficient documentation

## 2024-04-23 DIAGNOSIS — Z923 Personal history of irradiation: Secondary | ICD-10-CM | POA: Insufficient documentation

## 2024-04-23 DIAGNOSIS — I3481 Nonrheumatic mitral (valve) annulus calcification: Secondary | ICD-10-CM | POA: Diagnosis not present

## 2024-04-23 DIAGNOSIS — Z90710 Acquired absence of both cervix and uterus: Secondary | ICD-10-CM | POA: Insufficient documentation

## 2024-04-23 DIAGNOSIS — Z91041 Radiographic dye allergy status: Secondary | ICD-10-CM | POA: Diagnosis not present

## 2024-04-23 DIAGNOSIS — Z888 Allergy status to other drugs, medicaments and biological substances status: Secondary | ICD-10-CM | POA: Insufficient documentation

## 2024-04-23 DIAGNOSIS — K5730 Diverticulosis of large intestine without perforation or abscess without bleeding: Secondary | ICD-10-CM | POA: Diagnosis not present

## 2024-04-23 LAB — CBC WITH DIFFERENTIAL (CANCER CENTER ONLY)
Abs Immature Granulocytes: 0.02 K/uL (ref 0.00–0.07)
Basophils Absolute: 0.1 K/uL (ref 0.0–0.1)
Basophils Relative: 1 %
Eosinophils Absolute: 0.3 K/uL (ref 0.0–0.5)
Eosinophils Relative: 4 %
HCT: 42.7 % (ref 36.0–46.0)
Hemoglobin: 14.8 g/dL (ref 12.0–15.0)
Immature Granulocytes: 0 %
Lymphocytes Relative: 34 %
Lymphs Abs: 2.4 K/uL (ref 0.7–4.0)
MCH: 32.1 pg (ref 26.0–34.0)
MCHC: 34.7 g/dL (ref 30.0–36.0)
MCV: 92.6 fL (ref 80.0–100.0)
Monocytes Absolute: 0.6 K/uL (ref 0.1–1.0)
Monocytes Relative: 8 %
Neutro Abs: 3.9 K/uL (ref 1.7–7.7)
Neutrophils Relative %: 53 %
Platelet Count: 240 K/uL (ref 150–400)
RBC: 4.61 MIL/uL (ref 3.87–5.11)
RDW: 13.1 % (ref 11.5–15.5)
WBC Count: 7.2 K/uL (ref 4.0–10.5)
nRBC: 0 % (ref 0.0–0.2)

## 2024-04-23 LAB — CMP (CANCER CENTER ONLY)
ALT: 14 U/L (ref 0–44)
AST: 16 U/L (ref 15–41)
Albumin: 3.8 g/dL (ref 3.5–5.0)
Alkaline Phosphatase: 77 U/L (ref 38–126)
Anion gap: 5 (ref 5–15)
BUN: 15 mg/dL (ref 8–23)
CO2: 30 mmol/L (ref 22–32)
Calcium: 9.7 mg/dL (ref 8.9–10.3)
Chloride: 107 mmol/L (ref 98–111)
Creatinine: 1.03 mg/dL — ABNORMAL HIGH (ref 0.44–1.00)
GFR, Estimated: 58 mL/min — ABNORMAL LOW (ref >60)
Glucose, Bld: 90 mg/dL (ref 70–99)
Potassium: 4.6 mmol/L (ref 3.5–5.1)
Sodium: 142 mmol/L (ref 135–145)
Total Bilirubin: 0.3 mg/dL (ref 0.0–1.2)
Total Protein: 7 g/dL (ref 6.5–8.1)

## 2024-04-23 LAB — LACTATE DEHYDROGENASE: LDH: 116 U/L (ref 98–192)

## 2024-04-23 NOTE — Progress Notes (Unsigned)
 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 04/03/2021: establish care with Dr. Federico 06/10/21-07/03/2021: definitive radiation therapy 10/01/2021: PET CT scan showed hypermetabolic soft tissue lesion of the right chest wall is decreased in size and demonstrates decreased hypermetabolic activity. Deauville 2. Consistent with response to therapy.   Interval History:  Tina [name] 73 y.o. 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 The patient's last visit was on 10/24/2023. In the interim since the last visit she has had no major changes in her health.   On exam today [name] reports she has been well overall in interim since her last visit.  She had a good 4 July.  She reports that she does have some occasional congestion but no runny nose, sore throat, cough.  She reports her appetite can be on and off though her energy levels are good.  She denies any bumps or lumps concerning for lymphadenopathy.  She has not had any recent fevers, chills, sweats.  She notes that she recently stopped taking Fosamax.  She notes nothing else out of the ordinary in her health in the interim since her last visit.  She did however twinges nerve in her shoulder when she was pulling a lawn chair.  Otherwise she feels well and has no questions concerns or complaints.  A full 10 point ROS is  otherwise negative.  MEDICAL HISTORY:  Past Medical History:  Diagnosis Date   Anxiety    Bronchitis    hx   COPD (chronic obstructive pulmonary disease) (HCC)    smoker 1ppp   Depression    Hyperlipidemia    Smoker    1ppd    SURGICAL HISTORY: Past Surgical History:  Procedure Laterality Date   ABDOMINAL HYSTERECTOMY     BACK SURGERY     BIOPSY OF SKIN SUBCUTANEOUS TISSUE AND/OR MUCOUS MEMBRANE N/A 05/07/2021   Procedure: EXCISIONAL BIOPSY ABDOMINAL WALL MASS;  Surgeon: Dasie Leonor CROME, MD;  Location: [facility] OR;  Service: General;  Laterality: N/A;   CARPAL TUNNEL RELEASE     rt   CERVICAL DISC SURGERY  10/04/2010   CHOLECYSTECTOMY     KNEE ARTHROSCOPY     lft   KNEE ARTHROSCOPY Right 06/10/2020   Procedure: RIGHT KNEE ARTHROSCOPY AND DEBRIDEMENT;  Surgeon: Harden Jerona GAILS, MD;  Location: [facility];  Service: Orthopedics;  Laterality: Right;   LUMBAR LAMINECTOMY  09/30/2011   Procedure: MICRODISCECTOMY LUMBAR LAMINECTOMY;  Surgeon: Lynwood FORBES Better, MD;  Location: [facility] OR;  Service: Orthopedics;  Laterality: Right;  Right L4-5 Microdiscectomy using MIS approach    SOCIAL HISTORY: Social History   Socioeconomic History   Marital status: Married    Spouse name: Not on file   Number of children: Not on file   Years of education: Not on file   Highest education level: Not on file  Occupational History   Not on file  Tobacco Use   Smoking status: Every Day    Current packs/day: 1.00  Types: Cigarettes   Smokeless tobacco: Never  Substance and Sexual Activity   Alcohol use: No   Drug use: No   Sexual activity: Not on file    Comment: hysterectomy  Other Topics Concern   Not on file  Social History Narrative   Not on file   Social Drivers of Health   Financial Resource Strain: Low Risk  (04/30/2021)   Overall Financial Resource Strain (CARDIA)    Difficulty of Paying Living Expenses: Not very hard  Food Insecurity: No Food Insecurity (04/30/2021)    Hunger Vital Sign    Worried About Running Out of Food in the Last Year: Never true    Ran Out of Food in the Last Year: Never true  Transportation Needs: No Transportation Needs (04/30/2021)   PRAPARE - Administrator, Civil Service (Medical): No    Lack of Transportation (Non-Medical): No  Physical Activity: Not on file  Stress: Stress Concern Present (04/30/2021)   [employer] of Occupational Health - Occupational Stress Questionnaire    Feeling of Stress : To some extent  Social Connections: Unknown (04/01/2022)   [facility]   Social Network    Social Network: Not on file  Intimate Partner Violence: Unknown (04/01/2022)   Received from [facility]   HITS    Physically Hurt: Not on file    Insult or Talk Down To: Not on file    Threaten Physical Harm: Not on file    Scream or Curse: Not on file    FAMILY HISTORY: No family history on file.  ALLERGIES:  is allergic to prednisone, dilaudid [hydromorphone hcl], percocet [oxycodone-acetaminophen ], contrast media [iodinated contrast media], and tape.  MEDICATIONS:  Current Outpatient Medications  Medication Sig Dispense Refill   alendronate (FOSAMAX) 70 MG tablet 1 tablet 30 minutes before the first food, beverage or medicine of the day with plain water Orally once a week for 28 days     ALPRAZolam  (XANAX ) 0.5 MG tablet Take 0.5 mg by mouth 2 (two) times daily.     atorvastatin (LIPITOR) 20 MG tablet Take 20 mg by mouth daily.     CALCIUM PO Take 600 mg by mouth daily.     desipramine  (NORPRAMIN ) 50 MG tablet Take 50 mg by mouth daily.     HYDROcodone -acetaminophen  (NORCO/VICODIN) 5-325 MG tablet Take 1 tablet by mouth every 6 (six) hours as needed for moderate pain (pain score 4-6). 20 tablet 0   ibuprofen (ADVIL) 200 MG tablet Take 200 mg by mouth every 6 (six) hours as needed for headache or moderate pain.     Multiple Vitamins-Minerals (MULTIVITAMINS THER. W/MINERALS) TABS Take 1 tablet by  mouth daily.     sertraline  (ZOLOFT ) 100 MG tablet Take 200 mg by mouth daily.     vitamin C (ASCORBIC ACID) 500 MG tablet Take 1,000 mg by mouth daily.     No current facility-administered medications for this visit.    REVIEW OF SYSTEMS:   Constitutional: ( - ) fevers, ( - )  chills , ( - ) night sweats Eyes: ( - ) blurriness of vision, ( - ) double vision, ( - ) watery eyes Ears, nose, mouth, throat, and face: ( - ) mucositis, ( - ) sore throat Respiratory: ( - ) cough, ( - ) dyspnea, ( - ) wheezes Cardiovascular: ( - ) palpitation, ( - ) chest discomfort, ( - ) lower extremity swelling Gastrointestinal:  ( - ) nausea, ( - ) heartburn, ( - )  change in bowel habits Skin: ( - ) abnormal skin rashes Lymphatics: ( - ) new lymphadenopathy, ( - ) easy bruising Neurological: ( - ) numbness, ( - ) tingling, ( - ) new weaknesses Behavioral/Psych: ( - ) mood change, ( - ) new changes  All other systems were reviewed with the patient and are negative.  PHYSICAL EXAMINATION: ECOG PERFORMANCE STATUS: 0 - Asymptomatic  Vitals:   04/23/24 1045  BP: 123/89  Pulse: 95  Resp: 18  Temp: 97.6 F (36.4 C)  SpO2: 99%      Filed Weights   04/23/24 1045  Weight: 250 lb 1 oz (113.4 kg)       GENERAL: Well-appearing elderly Caucasian female, alert, no distress and comfortable SKIN: skin color, texture, turgor are normal, no rashes EYES: conjunctiva are pink and non-injected, sclera clear LUNGS: clear to auscultation and percussion with normal breathing effort HEART: regular rate & rhythm and no murmurs and no lower extremity edema Musculoskeletal: no cyanosis of digits and no clubbing  PSYCH: alert & oriented x 3, fluent speech NEURO: no focal motor/sensory deficits  LABORATORY DATA:  I have reviewed the data as listed    Latest Ref Rng & Units 04/23/2024   10:06 AM 10/24/2023    9:47 AM 04/25/2023    9:44 AM  CBC  WBC 4.0 - 10.5 K/uL 7.2  7.5  7.7   Hemoglobin 12.0 - 15.0 g/dL  85.1  85.0  85.0   Hematocrit 36.0 - 46.0 % 42.7  43.1  43.9   Platelets 150 - 400 K/uL 240  226  246        Latest Ref Rng & Units 04/23/2024   10:06 AM 10/24/2023    9:47 AM 04/25/2023    9:44 AM  CMP  Glucose 70 - 99 mg/dL 90  895  75   BUN 8 - 23 mg/dL 15  14  14    Creatinine 0.44 - 1.00 mg/dL 8.96  9.05  8.99   Sodium 135 - 145 mmol/L 142  139  141   Potassium 3.5 - 5.1 mmol/L 4.6  3.9  3.9   Chloride 98 - 111 mmol/L 107  106  106   CO2 22 - 32 mmol/L 30  27  29    Calcium 8.9 - 10.3 mg/dL 9.7  9.5  9.5   Total Protein 6.5 - 8.1 g/dL 7.0  7.0  6.8   Total Bilirubin 0.0 - 1.2 mg/dL 0.3  0.4  0.3   Alkaline Phos 38 - 126 U/L 77  76  75   AST 15 - 41 U/L 16  17  16    ALT 0 - 44 U/L 14  15  15      RADIOGRAPHIC STUDIES: CT CHEST ABDOMEN PELVIS WO CONTRAST Result Date: 04/23/2024 CLINICAL DATA:  Follicular lymphoma. Restaging. * Tracking Code: BO * EXAM: CT CHEST, ABDOMEN AND PELVIS WITHOUT CONTRAST TECHNIQUE: Multidetector CT imaging of the chest, abdomen and pelvis was performed following the standard protocol without IV contrast. RADIATION DOSE REDUCTION: This exam was performed according to the departmental dose-optimization program which includes automated exposure control, adjustment of the mA and/or kV according to patient size and/or use of iterative reconstruction technique. COMPARISON:  04/20/2023 FINDINGS: CT CHEST FINDINGS Cardiovascular: The heart size is normal. No substantial pericardial effusion. Coronary artery calcification is evident. Moderate atherosclerotic calcification is noted in the wall of the thoracic aorta. Mitral annular calcification evident. Mediastinum/Nodes: No mediastinal lymphadenopathy. No evidence for gross hilar lymphadenopathy although  assessment is limited by the lack of intravenous contrast on the current study. The esophagus has normal imaging features. There is no axillary lymphadenopathy. Lungs/Pleura: Scattered areas of paraseptal emphysema again  noted. No suspicious pulmonary nodule or mass. No focal airspace consolidation. No pleural effusion. Musculoskeletal: No worrisome lytic or sclerotic osseous abnormality. Subtle, tiny non confluent focus of soft tissue density in the anterior right paramidline chest wall is unchanged (see 44/2). This corresponds to the 2.7 x 1.7 cm soft tissue nodule seen on previous PET-CT of 04/24/2021. CT ABDOMEN PELVIS FINDINGS Hepatobiliary: No suspicious focal abnormality in the liver on this study without intravenous contrast. Gallbladder is surgically absent. No intrahepatic or extrahepatic biliary dilation. Pancreas: No focal mass lesion. No dilatation of the main duct. No intraparenchymal cyst. No peripancreatic edema. Spleen: No splenomegaly. No suspicious focal mass lesion. Adrenals/Urinary Tract: Right adrenal gland unremarkable. Stable thickening of the left adrenal gland. Kidneys unremarkable. No evidence for hydroureter. The urinary bladder appears normal for the degree of distention. Stomach/Bowel: Stomach is unremarkable. No gastric wall thickening. No evidence of outlet obstruction. Duodenum is normally positioned as is the ligament of Treitz. Polypoid lesion noted in the antrum of the stomach (image 69/2), probably present on the previous study although less conspicuous at that time. Duodenum is normally positioned as is the ligament of Treitz. No small bowel wall thickening. No small bowel dilatation. The terminal ileum is normal. The appendix is normal. No gross colonic mass. No colonic wall thickening. Diverticular changes are noted in the left colon without evidence of diverticulitis. Vascular/Lymphatic: There is advanced atherosclerotic calcification of the abdominal aorta without aneurysm. There is no gastrohepatic or hepatoduodenal ligament lymphadenopathy. No retroperitoneal or mesenteric lymphadenopathy. No pelvic sidewall lymphadenopathy. Reproductive: Hysterectomy.  There is no adnexal mass. Other: No  intraperitoneal free fluid. Musculoskeletal: No worrisome lytic or sclerotic osseous abnormality. IMPRESSION: 1. Stable exam. No evidence for recurrent lymphoma in the chest, abdomen, or pelvis. 2. Subtle, tiny non confluent focus of soft tissue density in the anterior right paramidline chest wall is unchanged. This corresponds to the 2.7 x 1.7 cm soft tissue nodule seen on previous PET-CT of 04/24/2021. 3. 12 mm polypoid lesion in the antrum of the stomach, probably present on the previous study although less conspicuous at that time. Endoscopy recommended to further evaluate. 4. Left colonic diverticulosis without diverticulitis. 5. Aortic Atherosclerosis (ICD10-I70.0) and Emphysema (ICD10-J43.9). Electronically Signed   By: Camellia Candle M.D.   On: 04/23/2024 10:41    ASSESSMENT & PLAN Tina [name] 73 y.o. 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 -- Patient has completed definitive radiation to the lymphomatous mass of the abdomen.  This should be curative therapy --labs today show Rosenbloom blood cell count 7.2, hemoglobin 14.8, MCV 92.6, platelets 240 --post treatment PET CT scan on 10/01/2021 showed completed response to treatment with residual lesion showing Deauville 2  --No clinical signs of recurrence. Continue with surveillance.  --continue CT scan annually x 3 years, due in July 2025.   --Plan have the patient return to clinic in 6 months with CT scan .  No orders of the defined types were placed in this encounter.   All questions were answered. The patient knows to call the clinic with any problems, questions or concerns.  I have spent a total of 25 minutes minutes of face-to-face  and non-face-to-face time, preparing to see the patient,  performing a medically appropriate examination, counseling and  educating the patient, ordering tests/procedures, documenting clinical information in the electronic health record, independently interpreting results and communicating results to the patient, and care coordination.   Norleen IVAR Kidney, MD Department of Hematology/Oncology [facility] Phone: [phone] Pager: [phone] Email: [email or web address]  04/24/2024 8:34 PM

## 2024-04-27 DIAGNOSIS — R92323 Mammographic fibroglandular density, bilateral breasts: Secondary | ICD-10-CM | POA: Diagnosis not present

## 2024-04-27 DIAGNOSIS — Z1231 Encounter for screening mammogram for malignant neoplasm of breast: Secondary | ICD-10-CM | POA: Diagnosis not present

## 2024-05-29 ENCOUNTER — Telehealth: Payer: Self-pay | Admitting: Orthopedic Surgery

## 2024-05-29 NOTE — Telephone Encounter (Signed)
 Pt states she mailed Dr Harden her handicap placard to be filled out. Please call pt about this matter at [phone]

## 2024-05-29 NOTE — Telephone Encounter (Signed)
 This has been received and is pending signature from Dr. Harden. Will mail once signed.

## 2024-05-31 ENCOUNTER — Encounter: Payer: Self-pay | Admitting: Orthopedic Surgery

## 2024-06-01 ENCOUNTER — Other Ambulatory Visit (HOSPITAL_BASED_OUTPATIENT_CLINIC_OR_DEPARTMENT_OTHER): Payer: Self-pay

## 2024-06-01 ENCOUNTER — Telehealth (HOSPITAL_BASED_OUTPATIENT_CLINIC_OR_DEPARTMENT_OTHER): Payer: Self-pay | Admitting: Physician Assistant

## 2024-06-01 ENCOUNTER — Ambulatory Visit (HOSPITAL_BASED_OUTPATIENT_CLINIC_OR_DEPARTMENT_OTHER): Admitting: Physician Assistant

## 2024-06-01 ENCOUNTER — Ambulatory Visit (HOSPITAL_BASED_OUTPATIENT_CLINIC_OR_DEPARTMENT_OTHER)

## 2024-06-01 ENCOUNTER — Other Ambulatory Visit (HOSPITAL_BASED_OUTPATIENT_CLINIC_OR_DEPARTMENT_OTHER): Payer: Self-pay | Admitting: Physician Assistant

## 2024-06-01 ENCOUNTER — Encounter (HOSPITAL_BASED_OUTPATIENT_CLINIC_OR_DEPARTMENT_OTHER): Payer: Self-pay | Admitting: Physician Assistant

## 2024-06-01 DIAGNOSIS — M542 Cervicalgia: Secondary | ICD-10-CM

## 2024-06-01 DIAGNOSIS — Z981 Arthrodesis status: Secondary | ICD-10-CM | POA: Diagnosis not present

## 2024-06-01 MED ORDER — MELOXICAM 7.5 MG PO TABS: 7.5000 mg | ORAL_TABLET | Freq: Every day | ORAL | 0 refills | Status: AC

## 2024-06-01 MED ORDER — MELOXICAM 7.5 MG PO TABS
7.5000 mg | ORAL_TABLET | Freq: Every day | ORAL | 0 refills | Status: DC
  Filled 2024-06-01: qty 21, 21d supply, fill #0

## 2024-06-01 NOTE — Addendum Note (Signed)
 Addended by: CLEMETINE RONAL DRAGON on: 06/01/2024 04:41 PM   Modules accepted: Orders

## 2024-06-01 NOTE — Progress Notes (Signed)
 Office Visit Note   Patient: Tina [name]           Date of Birth: 1951-03-11           MRN: [id number] Visit Date: 06/01/2024              Requested by: Claudene Pellet, MD [phone] [address] [place],  [place] [address] PCP: Claudene Pellet, MD  Chief Complaint  Patient presents with   Neck - Pain      HPI: Patient is a pleasant 73 year old woman who comes in today complaining of pain that radiates down her right arm with associated tingling and numbness.  She was seen in June by Rocky who thought it might be shoulder related did not get any relief from an injection.  She has been taking hydrocodone  without relief.  She is status post L4-5 discectomy with fusion with Dr. Lucilla in 2012.  She did receive an injection in her shoulder by Rocky as she had some impingement type findings.  She said this helped but it took 2 months to help. Assessment & Plan: Visit Diagnoses:  1. Neck pain     Plan: She was seen in June by Rocky who thought it might be shoulder related did not get any relief from an injection.  She has been taking hydrocodone  without relief.  She is status post L4-5 discectomy with fusion with Dr. Lucilla in 2012.  She did receive an injection in her shoulder by Rocky as she had some impingement type findings.  She said this helped but it took 2 months to help.  I think examining her today she has probably little bit of both.  Had a long conversation with her she is already taking hydrocodone  and says it does not help.  She does tolerate high ibuprofen but I will place her on Mobic  daily would like for her to be seen by physical therapy.  If she does not improve could be follow-up for further evaluation possible MRI   Follow-Up Instructions: No follow-ups on file.   Ortho Exam  Patient is alert, oriented, no adenopathy, well-dressed, normal affect, normal respiratory effort. Examination of her neck she has no step-offs a little bit of spasm.  She has stiffness that would  be expected with her fusion of flexion extension side-to-side turning.  She has generalized diffuse paresthesias in the hand but no atrophy.  She has good biceps triceps strength.  She also has some limited range of motion of her shoulder.  She is neurovascular intact    Imaging: No results found. No images are attached to the encounter.  Labs: Lab Results  Component Value Date   LABURIC 5.8 04/03/2021   LABURIC 5.7 07/30/2020     Lab Results  Component Value Date   ALBUMIN 3.8 04/23/2024   ALBUMIN 4.0 10/24/2023   ALBUMIN 3.9 04/25/2023    No results found for: MG No results found for: VD25OH  No results found for: PREALBUMIN    Latest Ref Rng & Units 04/23/2024   10:06 AM 10/24/2023    9:47 AM 04/25/2023    9:44 AM  CBC EXTENDED  WBC 4.0 - 10.5 K/uL 7.2  7.5  7.7   RBC 3.87 - 5.11 MIL/uL 4.61  4.64  4.71   Hemoglobin 12.0 - 15.0 g/dL 85.1  85.0  85.0   HCT 36.0 - 46.0 % 42.7  43.1  43.9   Platelets 150 - 400 K/uL 240  226  246  NEUT# 1.7 - 7.7 K/uL 3.9  4.2  4.4   Lymph# 0.7 - 4.0 K/uL 2.4  2.3  2.4      There is no height or weight on file to calculate BMI.  Orders:  Orders Placed This Encounter  Procedures   DG Cervical Spine 2 or 3 views   No orders of the defined types were placed in this encounter.    Procedures: No procedures performed  Clinical Data: No additional findings.  ROS:  All other systems negative, except as noted in the HPI. Review of Systems  Objective: Vital Signs: There were no vitals taken for this visit.  Specialty Comments:  No specialty comments available.  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  Past Surgical History:  Procedure Laterality Date   ABDOMINAL HYSTERECTOMY     BACK SURGERY     BIOPSY OF SKIN SUBCUTANEOUS TISSUE AND/OR MUCOUS MEMBRANE N/A 05/07/2021   Procedure: EXCISIONAL BIOPSY ABDOMINAL WALL MASS;  Surgeon: Dasie Leonor CROME, MD;  Location: [facility] OR;  Service: General;  Laterality: N/A;   CARPAL TUNNEL RELEASE     rt   CERVICAL DISC SURGERY  10/04/2010   CHOLECYSTECTOMY     KNEE ARTHROSCOPY     lft   KNEE ARTHROSCOPY Right 06/10/2020   Procedure: RIGHT KNEE ARTHROSCOPY AND DEBRIDEMENT;  Surgeon: Harden Jerona GAILS, MD;  Location: [facility];  Service: Orthopedics;  Laterality: Right;   LUMBAR LAMINECTOMY  09/30/2011   Procedure: MICRODISCECTOMY LUMBAR LAMINECTOMY;  Surgeon: Lynwood FORBES Better, MD;  Location: [facility] OR;  Service: Orthopedics;  Laterality: Right;  Right L4-5 Microdiscectomy using MIS approach   Social History   Occupational History   Not on file  Tobacco Use   Smoking status: Every Day    Current packs/day: 1.00    Types: Cigarettes   Smokeless tobacco: Never  Substance and Sexual Activity   Alcohol use: No   Drug use: No   Sexual activity: Not on file    Comment: hysterectomy

## 2024-06-02 ENCOUNTER — Other Ambulatory Visit (HOSPITAL_BASED_OUTPATIENT_CLINIC_OR_DEPARTMENT_OTHER): Payer: Self-pay

## 2024-06-05 ENCOUNTER — Other Ambulatory Visit (HOSPITAL_BASED_OUTPATIENT_CLINIC_OR_DEPARTMENT_OTHER): Payer: Self-pay

## 2024-06-05 ENCOUNTER — Telehealth (HOSPITAL_BASED_OUTPATIENT_CLINIC_OR_DEPARTMENT_OTHER): Payer: Self-pay | Admitting: Physician Assistant

## 2024-06-05 DIAGNOSIS — M542 Cervicalgia: Secondary | ICD-10-CM

## 2024-06-05 NOTE — Telephone Encounter (Signed)
 Patient states she in a lot of pain and can not sleep at night and patient states she has a rash and she does not know if it is coming from the meds. Also she her shoulder and arm is hurting. Best contact [phone]

## 2024-06-05 NOTE — Telephone Encounter (Signed)
 MRI was placed and referral sent to [facility].

## 2024-06-06 ENCOUNTER — Other Ambulatory Visit (HOSPITAL_BASED_OUTPATIENT_CLINIC_OR_DEPARTMENT_OTHER): Payer: Self-pay | Admitting: Physician Assistant

## 2024-06-06 ENCOUNTER — Encounter (HOSPITAL_BASED_OUTPATIENT_CLINIC_OR_DEPARTMENT_OTHER): Payer: Self-pay | Admitting: Physician Assistant

## 2024-06-07 ENCOUNTER — Telehealth (HOSPITAL_BASED_OUTPATIENT_CLINIC_OR_DEPARTMENT_OTHER): Payer: Self-pay | Admitting: Student

## 2024-06-07 NOTE — Telephone Encounter (Signed)
 Called advised per Tina [name] last note. Stated she understood and was going to stop the hydrocodone  also.

## 2024-06-07 NOTE — Telephone Encounter (Signed)
 Patient states that the meds that Maryann put her on is giving her a rash. Please advise

## 2024-06-09 ENCOUNTER — Ambulatory Visit
Admission: RE | Admit: 2024-06-09 | Discharge: 2024-06-09 | Disposition: A | Discharge status: Home / Self Care | Source: Ambulatory Visit | Attending: Physician Assistant | Admitting: Physician Assistant

## 2024-06-09 DIAGNOSIS — M542 Cervicalgia: Secondary | ICD-10-CM

## 2024-06-11 ENCOUNTER — Other Ambulatory Visit: Payer: Self-pay | Admitting: Physician Assistant

## 2024-06-11 ENCOUNTER — Telehealth: Payer: Self-pay | Admitting: Physical Medicine and Rehabilitation

## 2024-06-11 MED ORDER — DIAZEPAM 5 MG PO TABS: 5.0000 mg | ORAL_TABLET | Freq: Once | ORAL | 0 refills | Status: AC

## 2024-06-11 NOTE — Telephone Encounter (Signed)
 Patient called and said because she was in so much pain she couldn't do her MRI. They told her she needs something for pain in order to do it. CB#[phone]

## 2024-06-15 ENCOUNTER — Inpatient Hospital Stay: Admission: RE | Admit: 2024-06-15 | Source: Ambulatory Visit

## 2024-06-18 ENCOUNTER — Ambulatory Visit: Admitting: Physical Medicine and Rehabilitation

## 2024-06-18 ENCOUNTER — Telehealth: Payer: Self-pay

## 2024-06-18 NOTE — Telephone Encounter (Signed)
 Patient advising she was unable to have MRI due to pain, Patient also advised Imaging advised her to go to hospital for Imaging due to patients pain.

## 2024-06-19 ENCOUNTER — Telehealth: Payer: Self-pay | Admitting: Radiology

## 2024-06-19 DIAGNOSIS — M542 Cervicalgia: Secondary | ICD-10-CM

## 2024-06-19 NOTE — Telephone Encounter (Signed)
 Patient states that she does not know what to do. She has tried MRI x 2 at [facility] and has not been able to go through with it due to pain. It was recommended that she go to the hospital and have MRI under sedation.  I advised this is a study that would be scheduled 4-6 months from now.  Please advise if you would like to change order to MRI under anesthesia.  CB for pt [phone]

## 2024-06-20 ENCOUNTER — Telehealth: Payer: Self-pay | Admitting: Physical Medicine and Rehabilitation

## 2024-06-20 NOTE — Telephone Encounter (Signed)
 Patient called and said she is having problems doing the MRI because as much pain she is in. CB#[phone]

## 2024-06-20 NOTE — Addendum Note (Signed)
 Addended by: TRINDA DEANE HERO on: 06/20/2024 02:14 PM   Modules accepted: Orders

## 2024-06-20 NOTE — Telephone Encounter (Signed)
 Referral entered.    I called patient to advise. She is upset as she states this has been going on since June and she cannot sleep. She was told that she could not see Dr. Georgina until the end of October and she does not think she can wait that long.    Christy-would you have any place to squeeze patient in to your schedule?  I tried to explain Dr. Jeraline schedule is full and if we were ordering a MRI under sedation, it would be 4-6 months before that would be done.  I did not know if there as anything that you could offer her?

## 2024-06-22 NOTE — Telephone Encounter (Signed)
I left voicemail requesting return call. 

## 2024-06-25 NOTE — Addendum Note (Signed)
 Addended by: TRINDA DEANE HERO on: 06/25/2024 05:05 PM   Modules accepted: Orders

## 2024-06-25 NOTE — Telephone Encounter (Signed)
 Referral entered. Appointment cancelled.

## 2024-06-26 ENCOUNTER — Telehealth: Payer: Self-pay | Admitting: Physician Assistant

## 2024-06-26 NOTE — Telephone Encounter (Signed)
 Patient called to let her know that she didn't get a call from the pain management place. CB#[phone]

## 2024-07-02 DIAGNOSIS — M4692 Unspecified inflammatory spondylopathy, cervical region: Secondary | ICD-10-CM | POA: Diagnosis not present

## 2024-07-02 DIAGNOSIS — M4722 Other spondylosis with radiculopathy, cervical region: Secondary | ICD-10-CM | POA: Diagnosis not present

## 2024-07-05 ENCOUNTER — Ambulatory Visit: Admitting: Physical Medicine and Rehabilitation

## 2024-07-06 ENCOUNTER — Other Ambulatory Visit (HOSPITAL_COMMUNITY): Payer: Self-pay | Admitting: Orthopedic Surgery

## 2024-07-06 DIAGNOSIS — M542 Cervicalgia: Secondary | ICD-10-CM

## 2024-07-10 ENCOUNTER — Ambulatory Visit: Admitting: Family

## 2024-07-10 ENCOUNTER — Encounter: Payer: Self-pay | Admitting: Family

## 2024-07-10 DIAGNOSIS — M170 Bilateral primary osteoarthritis of knee: Secondary | ICD-10-CM

## 2024-07-10 MED ORDER — METHYLPREDNISOLONE ACETATE 40 MG/ML IJ SUSP
40.0000 mg | INTRAMUSCULAR | Status: AC | PRN
  Administered 2024-07-10: 40 mg via INTRA_ARTICULAR

## 2024-07-10 MED ORDER — LIDOCAINE HCL 1 % IJ SOLN
5.0000 mL | INTRAMUSCULAR | Status: AC | PRN
  Administered 2024-07-10: 5 mL

## 2024-07-10 NOTE — Progress Notes (Signed)
 Office Visit Note   Patient: Tina [name]           Date of Birth: [birth year]           MRN: [id number] Visit Date: 07/10/2024              Requested by: Claudene Pellet, MD [id number] [address] [place],  [place] [address] PCP: Claudene Pellet, MD  Chief Complaint  Patient presents with   Right Knee - Pain    Bilateral cortisone injections   Left Knee - Pain      HPI: The patient is a 73 year old woman who is seen for bilateral knee pain.  She has longstanding history of osteoarthritic pain right knee no locking or catching or giving way.  Aching pain especially medially last had a Depo-Medrol  injection March of last year.  Her left knee has been gradually bothering her more and more over the last 6 months she is having primarily medial pain without any mechanical symptoms  Assessment & Plan: Visit Diagnoses: No diagnosis found.  Plan: Depo-Medrol  injection bilateral knees.  Patient tolerated well.  Will request authorization for supplemental injections.  She will follow-up as needed.  Follow-Up Instructions: No follow-ups on file.   Right Knee Exam   Muscle Strength  The patient has normal right knee strength.  Tenderness  The patient is experiencing tenderness in the medial joint line.  Range of Motion  The patient has normal right knee ROM.  Other  Swelling: none Effusion: no effusion present   Left Knee Exam   Muscle Strength  The patient has normal left knee strength.  Tenderness  The patient is experiencing tenderness in the medial joint line.  Range of Motion  The patient has normal left knee ROM.  Other  Swelling: none Effusion: no effusion present      Patient is alert, oriented, no adenopathy, well-dressed, normal affect, normal respiratory effort.     Imaging: No results found. No images are attached to the encounter.  Labs: Lab Results  Component Value Date   LABURIC 5.8 04/03/2021   LABURIC 5.7 07/30/2020     Lab  Results  Component Value Date   ALBUMIN 3.8 04/23/2024   ALBUMIN 4.0 10/24/2023   ALBUMIN 3.9 04/25/2023    No results found for: MG No results found for: VD25OH  No results found for: PREALBUMIN    Latest Ref Rng & Units 04/23/2024   10:06 AM 10/24/2023    9:47 AM 04/25/2023    9:44 AM  CBC EXTENDED  WBC 4.0 - 10.5 K/uL 7.2  7.5  7.7   RBC 3.87 - 5.11 MIL/uL 4.61  4.64  4.71   Hemoglobin 12.0 - 15.0 g/dL 85.1  85.0  85.0   HCT 36.0 - 46.0 % 42.7  43.1  43.9   Platelets 150 - 400 K/uL 240  226  246   NEUT# 1.7 - 7.7 K/uL 3.9  4.2  4.4   Lymph# 0.7 - 4.0 K/uL 2.4  2.3  2.4      There is no height or weight on file to calculate BMI.  Orders:  No orders of the defined types were placed in this encounter.  No orders of the defined types were placed in this encounter.    Procedures: Large Joint Inj: bilateral knee on 07/10/2024 9:23 AM Indications: pain Details: 18 G 1.5 in needle, anteromedial approach Medications (Right): 5 mL lidocaine  1 %; 40 mg methylPREDNISolone  acetate 40 MG/ML Medications (Left):  5 mL lidocaine  1 %; 40 mg methylPREDNISolone  acetate 40 MG/ML Consent was given by the patient.      Clinical Data: No additional findings.  ROS:  All other systems negative, except as noted in the HPI. Review of Systems  Objective: Vital Signs: There were no vitals taken for this visit.  Specialty Comments:  No specialty comments available.  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  Past Surgical History:  Procedure Laterality Date    ABDOMINAL HYSTERECTOMY     BACK SURGERY     BIOPSY OF SKIN SUBCUTANEOUS TISSUE AND/OR MUCOUS MEMBRANE N/A 05/07/2021   Procedure: EXCISIONAL BIOPSY ABDOMINAL WALL MASS;  Surgeon: Dasie Leonor CROME, MD;  Location: [facility] OR;  Service: General;  Laterality: N/A;   CARPAL TUNNEL RELEASE     rt   CERVICAL DISC SURGERY  10/04/2010   CHOLECYSTECTOMY     KNEE ARTHROSCOPY     lft   KNEE ARTHROSCOPY Right 06/10/2020   Procedure: RIGHT KNEE ARTHROSCOPY AND DEBRIDEMENT;  Surgeon: Harden Jerona GAILS, MD;  Location: [facility];  Service: Orthopedics;  Laterality: Right;   LUMBAR LAMINECTOMY  09/30/2011   Procedure: MICRODISCECTOMY LUMBAR LAMINECTOMY;  Surgeon: Lynwood FORBES Better, MD;  Location: [facility] OR;  Service: Orthopedics;  Laterality: Right;  Right L4-5 Microdiscectomy using MIS approach   Social History   Occupational History   Not on file  Tobacco Use   Smoking status: Every Day    Current packs/day: 1.00    Types: Cigarettes   Smokeless tobacco: Never  Substance and Sexual Activity   Alcohol use: No   Drug use: No   Sexual activity: Not on file    Comment: hysterectomy

## 2024-07-25 ENCOUNTER — Other Ambulatory Visit: Payer: Self-pay

## 2024-07-25 DIAGNOSIS — M170 Bilateral primary osteoarthritis of knee: Secondary | ICD-10-CM

## 2024-07-31 DIAGNOSIS — M542 Cervicalgia: Secondary | ICD-10-CM | POA: Diagnosis not present

## 2024-08-02 DIAGNOSIS — F3342 Major depressive disorder, recurrent, in full remission: Secondary | ICD-10-CM | POA: Diagnosis not present

## 2024-08-02 DIAGNOSIS — J309 Allergic rhinitis, unspecified: Secondary | ICD-10-CM | POA: Diagnosis not present

## 2024-08-02 DIAGNOSIS — F17210 Nicotine dependence, cigarettes, uncomplicated: Secondary | ICD-10-CM | POA: Diagnosis not present

## 2024-08-02 DIAGNOSIS — I700 Atherosclerosis of aorta: Secondary | ICD-10-CM | POA: Diagnosis not present

## 2024-08-02 DIAGNOSIS — E785 Hyperlipidemia, unspecified: Secondary | ICD-10-CM | POA: Diagnosis not present

## 2024-08-02 DIAGNOSIS — R7303 Prediabetes: Secondary | ICD-10-CM | POA: Diagnosis not present

## 2024-08-02 DIAGNOSIS — C8590 Non-Hodgkin lymphoma, unspecified, unspecified site: Secondary | ICD-10-CM | POA: Diagnosis not present

## 2024-08-02 DIAGNOSIS — R195 Other fecal abnormalities: Secondary | ICD-10-CM | POA: Diagnosis not present

## 2024-08-06 ENCOUNTER — Encounter: Payer: Self-pay | Admitting: Radiology

## 2024-08-14 DIAGNOSIS — M5412 Radiculopathy, cervical region: Secondary | ICD-10-CM | POA: Diagnosis not present

## 2024-10-08 ENCOUNTER — Telehealth: Payer: Self-pay | Admitting: Hematology and Oncology

## 2024-10-08 NOTE — Telephone Encounter (Signed)
 I spoke with patient as she called in to reschedule lab and MD appointments from 10/22/2024 to 10/29/2024.

## 2024-10-22 ENCOUNTER — Ambulatory Visit: Admitting: Hematology and Oncology

## 2024-10-22 ENCOUNTER — Other Ambulatory Visit

## 2024-10-25 ENCOUNTER — Telehealth: Payer: Self-pay | Admitting: Hematology and Oncology

## 2024-10-25 NOTE — Telephone Encounter (Signed)
 Pt called to reschedule appt due to bad weather

## 2024-10-29 ENCOUNTER — Inpatient Hospital Stay: Admitting: Hematology and Oncology

## 2024-10-29 ENCOUNTER — Inpatient Hospital Stay

## 2024-11-21 ENCOUNTER — Inpatient Hospital Stay: Admitting: Hematology and Oncology

## 2024-11-21 ENCOUNTER — Inpatient Hospital Stay
